# Patient Record
Sex: Female | Born: 1960 | Race: Black or African American | Hispanic: No | Smoking: Never smoker
Health system: Southern US, Community
[De-identification: ages and names within clinical notes are randomized; demographics above are authoritative.]

## PROBLEM LIST (undated history)

## (undated) DIAGNOSIS — M1990 Unspecified osteoarthritis, unspecified site: Secondary | ICD-10-CM

## (undated) DIAGNOSIS — T4145XA Adverse effect of unspecified anesthetic, initial encounter: Secondary | ICD-10-CM

## (undated) DIAGNOSIS — T8859XA Other complications of anesthesia, initial encounter: Secondary | ICD-10-CM

## (undated) DIAGNOSIS — Z9889 Other specified postprocedural states: Secondary | ICD-10-CM

## (undated) DIAGNOSIS — I499 Cardiac arrhythmia, unspecified: Secondary | ICD-10-CM

## (undated) DIAGNOSIS — E039 Hypothyroidism, unspecified: Secondary | ICD-10-CM

## (undated) DIAGNOSIS — D649 Anemia, unspecified: Secondary | ICD-10-CM

## (undated) DIAGNOSIS — F419 Anxiety disorder, unspecified: Secondary | ICD-10-CM

## (undated) DIAGNOSIS — R112 Nausea with vomiting, unspecified: Secondary | ICD-10-CM

## (undated) HISTORY — PX: TONSILLECTOMY: SUR1361

## (undated) HISTORY — PX: KNEE ARTHROSCOPY: SHX127

## (undated) HISTORY — PX: JOINT REPLACEMENT: SHX530

---

## 2012-06-10 ENCOUNTER — Ambulatory Visit: Payer: Self-pay | Admitting: Orthopedic Surgery

## 2014-11-02 ENCOUNTER — Ambulatory Visit: Payer: Self-pay | Admitting: Orthopedic Surgery

## 2016-07-09 ENCOUNTER — Other Ambulatory Visit: Payer: Self-pay | Admitting: Orthopedic Surgery

## 2016-07-09 DIAGNOSIS — M48061 Spinal stenosis, lumbar region without neurogenic claudication: Secondary | ICD-10-CM

## 2017-12-30 ENCOUNTER — Other Ambulatory Visit: Payer: Self-pay | Admitting: Orthopedic Surgery

## 2017-12-30 ENCOUNTER — Ambulatory Visit
Admission: RE | Admit: 2017-12-30 | Discharge: 2017-12-30 | Disposition: A | Discharge status: Home / Self Care | Payer: Medicare Other | Source: Ambulatory Visit | Attending: Orthopedic Surgery | Admitting: Orthopedic Surgery

## 2017-12-30 DIAGNOSIS — M1712 Unilateral primary osteoarthritis, left knee: Secondary | ICD-10-CM | POA: Insufficient documentation

## 2017-12-30 DIAGNOSIS — M7122 Synovial cyst of popliteal space [Baker], left knee: Secondary | ICD-10-CM | POA: Insufficient documentation

## 2017-12-30 DIAGNOSIS — M25562 Pain in left knee: Secondary | ICD-10-CM | POA: Diagnosis present

## 2017-12-30 DIAGNOSIS — M2342 Loose body in knee, left knee: Secondary | ICD-10-CM | POA: Insufficient documentation

## 2018-01-27 ENCOUNTER — Encounter
Admission: RE | Admit: 2018-01-27 | Discharge: 2018-01-27 | Disposition: A | Discharge status: Home / Self Care | Payer: Medicare Other | Source: Ambulatory Visit | Attending: Orthopedic Surgery | Admitting: Orthopedic Surgery

## 2018-01-27 ENCOUNTER — Other Ambulatory Visit: Payer: Self-pay

## 2018-01-27 DIAGNOSIS — Z01812 Encounter for preprocedural laboratory examination: Secondary | ICD-10-CM | POA: Insufficient documentation

## 2018-01-27 HISTORY — DX: Unspecified osteoarthritis, unspecified site: M19.90

## 2018-01-27 HISTORY — DX: Hypothyroidism, unspecified: E03.9

## 2018-01-27 HISTORY — DX: Anxiety disorder, unspecified: F41.9

## 2018-01-27 HISTORY — DX: Anemia, unspecified: D64.9

## 2018-01-27 HISTORY — DX: Cardiac arrhythmia, unspecified: I49.9

## 2018-01-27 LAB — CBC
HEMATOCRIT: 35.9 % (ref 35.0–47.0)
Hemoglobin: 12.3 g/dL (ref 12.0–16.0)
MCH: 32.2 pg (ref 26.0–34.0)
MCHC: 34.3 g/dL (ref 32.0–36.0)
MCV: 93.7 fL (ref 80.0–100.0)
PLATELETS: 310 10*3/uL (ref 150–440)
RBC: 3.83 MIL/uL (ref 3.80–5.20)
RDW: 13.7 % (ref 11.5–14.5)
WBC: 7.2 10*3/uL (ref 3.6–11.0)

## 2018-01-27 LAB — URINALYSIS, ROUTINE W REFLEX MICROSCOPIC
Bilirubin Urine: NEGATIVE
Glucose, UA: NEGATIVE mg/dL
Hgb urine dipstick: NEGATIVE
Ketones, ur: NEGATIVE mg/dL
Leukocytes, UA: NEGATIVE
Nitrite: NEGATIVE
PROTEIN: NEGATIVE mg/dL
SPECIFIC GRAVITY, URINE: 1.009 (ref 1.005–1.030)
pH: 5 (ref 5.0–8.0)

## 2018-01-27 LAB — TYPE AND SCREEN
ABO/RH(D): O POS
ANTIBODY SCREEN: NEGATIVE

## 2018-01-27 LAB — BASIC METABOLIC PANEL
ANION GAP: 9 (ref 5–15)
BUN: 18 mg/dL (ref 6–20)
CO2: 26 mmol/L (ref 22–32)
Calcium: 9.2 mg/dL (ref 8.9–10.3)
Chloride: 104 mmol/L (ref 101–111)
Creatinine, Ser: 1 mg/dL (ref 0.44–1.00)
GFR calc Af Amer: >60 mL/min (ref >60)
GLUCOSE: 91 mg/dL (ref 65–99)
POTASSIUM: 3.7 mmol/L (ref 3.5–5.1)
Sodium: 139 mmol/L (ref 135–145)

## 2018-01-27 LAB — PROTIME-INR
INR: 1
Prothrombin Time: 13.1 seconds (ref 11.4–15.2)

## 2018-01-27 LAB — APTT: APTT: 34 s (ref 24–36)

## 2018-01-27 LAB — SURGICAL PCR SCREEN
MRSA, PCR: NEGATIVE
STAPHYLOCOCCUS AUREUS: NEGATIVE

## 2018-01-27 LAB — SEDIMENTATION RATE: SED RATE: 43 mm/h — AB (ref 0–30)

## 2018-01-27 NOTE — Patient Instructions (Addendum)
  Your procedure is scheduled on: Tuesday February 09, 2018 Report to Same Day Surgery 2nd floor medical mall (Medical Mall Entrance-take elevator on left to 2nd floor.  Check in with surgery information desk.) To find out your arrival time please call [phone] between 1PM - 3PM on Monday February 08, 2018  Remember: Instructions that are not followed completely may result in serious medical risk, up to and including death, or upon the discretion of your surgeon and anesthesiologist your surgery may need to be rescheduled.    _x___ 1. Do not eat food after midnight the night before your procedure. You may drink clear liquids up to 2 hours before you are scheduled to arrive at the hospital for your procedure.  Do not drink clear liquids within 2 hours of your scheduled arrival to the hospital.  Clear liquids include  --Water or Apple juice without pulp  --Clear carbohydrate beverage such as Gatorade  --Black Coffee or Clear Tea (No milk, no creamers, do not add anything to the coffee or tea Type 1 and type 2 diabetics should only drink water.  No gum chewing or hard candies.     __x__ 2. No Alcohol for 24 hours before or after surgery.   __x__3. No Smoking or e-cigarettes for 24 prior to surgery.  Do not use any chewable tobacco products for at least 6 hour prior to surgery   ____  4. Bring all medications with you on the day of surgery if instructed.    __x__ 5. Notify your doctor if there is any change in your medical condition     (cold, fever, infections).   __x__6. On the morning of surgery brush your teeth with toothpaste and water.  You may rinse your mouth with mouth wash if you wish.  Do not swallow any toothpaste or mouthwash.   Do not wear jewelry, make-up, hairpins, clips or nail polish.  Do not wear lotions, powders, or perfumes. You may wear deodorant.  Do not shave 48 hours prior to surgery.  Do not bring valuables to the hospital.    [facility] is not responsible for any  belongings or valuables.               Contacts, dentures or bridgework may not be worn into surgery.  Leave your suitcase in the car. After surgery it may be brought to your room.  For patients admitted to the hospital, discharge time is determined by your treatment team.    Patients discharged the day of surgery will not be allowed to drive home.  You will need someone to drive you home and stay with you the night of your procedure.    Please read over the following fact sheets that you were given:   [facility] Preparing for [facility] and or MRSA Information   _x___ Take anti-hypertensive listed below, cardiac, seizure, asthma,     anti-reflux and psychiatric medicines. These include:  1. Citalopram/Celexa  2. Fluticasone/Flonase  3. Gabapentin/Neurontin  4. Levothyroxine/Synthroid  5. Multivitamin/One-a-day  6. Lorcet as needed  _x___ Use CHG Soap or sage wipes as directed on instruction sheet.   _x___Stop Anti-inflammatories such as Advil, Aleve, Ibuprofen, Motrin, Naproxen, Naprosyn, Goodies powders or aspirin products. OK to take Tylenol and Celebrex.  Stop Etodolac/Lodine 7 days before surgery- Tuesday February 02, 2018   _x___ Stop supplements until after surgery.  But may continue Vitamin D, Vitamin B, and multivitamin.

## 2018-01-28 LAB — URINE CULTURE: Culture: NO GROWTH

## 2018-02-08 MED ORDER — CLINDAMYCIN PHOSPHATE 900 MG/50ML IV SOLN
900.0000 mg | Freq: Once | INTRAVENOUS | Status: AC
  Administered 2018-02-09: 900 mg via INTRAVENOUS

## 2018-02-08 MED ORDER — TRANEXAMIC ACID 1000 MG/10ML IV SOLN
1000.0000 mg | INTRAVENOUS | Status: DC
  Filled 2018-02-08: qty 10

## 2018-02-09 ENCOUNTER — Encounter: Payer: Self-pay | Admitting: *Deleted

## 2018-02-09 ENCOUNTER — Encounter
Admission: RE | Disposition: A | Discharge status: Home Health | Payer: Self-pay | Source: Ambulatory Visit | Attending: Orthopedic Surgery

## 2018-02-09 ENCOUNTER — Inpatient Hospital Stay
Admission: RE | Admit: 2018-02-09 | Discharge: 2018-02-11 | DRG: 470 | Disposition: A | Discharge status: Home Health | Payer: Medicare Other | Source: Ambulatory Visit | Attending: Orthopedic Surgery | Admitting: Orthopedic Surgery

## 2018-02-09 ENCOUNTER — Inpatient Hospital Stay: Payer: Medicare Other

## 2018-02-09 ENCOUNTER — Inpatient Hospital Stay: Payer: Medicare Other | Admitting: Anesthesiology

## 2018-02-09 ENCOUNTER — Other Ambulatory Visit: Payer: Self-pay

## 2018-02-09 DIAGNOSIS — Z79890 Hormone replacement therapy (postmenopausal): Secondary | ICD-10-CM

## 2018-02-09 DIAGNOSIS — E669 Obesity, unspecified: Secondary | ICD-10-CM | POA: Diagnosis present

## 2018-02-09 DIAGNOSIS — M1712 Unilateral primary osteoarthritis, left knee: Secondary | ICD-10-CM | POA: Diagnosis present

## 2018-02-09 DIAGNOSIS — E039 Hypothyroidism, unspecified: Secondary | ICD-10-CM | POA: Diagnosis present

## 2018-02-09 DIAGNOSIS — R269 Unspecified abnormalities of gait and mobility: Secondary | ICD-10-CM | POA: Diagnosis present

## 2018-02-09 DIAGNOSIS — Z6832 Body mass index (BMI) 32.0-32.9, adult: Secondary | ICD-10-CM | POA: Diagnosis not present

## 2018-02-09 DIAGNOSIS — M791 Myalgia, unspecified site: Secondary | ICD-10-CM | POA: Diagnosis present

## 2018-02-09 DIAGNOSIS — Z79899 Other long term (current) drug therapy: Secondary | ICD-10-CM

## 2018-02-09 DIAGNOSIS — G8918 Other acute postprocedural pain: Secondary | ICD-10-CM

## 2018-02-09 DIAGNOSIS — Z880 Allergy status to penicillin: Secondary | ICD-10-CM | POA: Diagnosis not present

## 2018-02-09 DIAGNOSIS — E0590 Thyrotoxicosis, unspecified without thyrotoxic crisis or storm: Secondary | ICD-10-CM | POA: Diagnosis present

## 2018-02-09 DIAGNOSIS — F419 Anxiety disorder, unspecified: Secondary | ICD-10-CM | POA: Diagnosis present

## 2018-02-09 DIAGNOSIS — Z96652 Presence of left artificial knee joint: Secondary | ICD-10-CM

## 2018-02-09 DIAGNOSIS — Z91048 Other nonmedicinal substance allergy status: Secondary | ICD-10-CM

## 2018-02-09 DIAGNOSIS — D649 Anemia, unspecified: Secondary | ICD-10-CM | POA: Diagnosis present

## 2018-02-09 DIAGNOSIS — M25562 Pain in left knee: Secondary | ICD-10-CM | POA: Diagnosis present

## 2018-02-09 HISTORY — PX: TOTAL KNEE ARTHROPLASTY: SHX125

## 2018-02-09 LAB — ABO/RH: ABO/RH(D): O POS

## 2018-02-09 LAB — CBC
HEMATOCRIT: 35.7 % (ref 35.0–47.0)
Hemoglobin: 11.8 g/dL — ABNORMAL LOW (ref 12.0–16.0)
MCH: 31.5 pg (ref 26.0–34.0)
MCHC: 33.2 g/dL (ref 32.0–36.0)
MCV: 94.7 fL (ref 80.0–100.0)
Platelets: 299 10*3/uL (ref 150–440)
RBC: 3.77 MIL/uL — ABNORMAL LOW (ref 3.80–5.20)
RDW: 13.5 % (ref 11.5–14.5)
WBC: 7.6 10*3/uL (ref 3.6–11.0)

## 2018-02-09 LAB — CREATININE, SERUM
Creatinine, Ser: 1.05 mg/dL — ABNORMAL HIGH (ref 0.44–1.00)
GFR calc Af Amer: >60 mL/min (ref >60)
GFR, EST NON AFRICAN AMERICAN: 58 mL/min — AB (ref >60)

## 2018-02-09 SURGERY — ARTHROPLASTY, KNEE, TOTAL
Anesthesia: Spinal | Laterality: Left

## 2018-02-09 MED ORDER — OXYCODONE HCL 5 MG/5ML PO SOLN: 5.0000 mg | Freq: Once | ORAL | Status: DC | PRN

## 2018-02-09 MED ORDER — BISACODYL 10 MG RE SUPP
10.0000 mg | Freq: Every day | RECTAL | Status: DC | PRN
  Administered 2018-02-11: 10 mg via RECTAL
  Filled 2018-02-09: qty 1

## 2018-02-09 MED ORDER — ONDANSETRON HCL 4 MG/2ML IJ SOLN
4.0000 mg | Freq: Four times a day (QID) | INTRAMUSCULAR | Status: DC | PRN
  Administered 2018-02-09: 4 mg via INTRAVENOUS
  Filled 2018-02-09: qty 2

## 2018-02-09 MED ORDER — MAGNESIUM CITRATE PO SOLN
1.0000 | Freq: Once | ORAL | Status: AC | PRN
  Administered 2018-02-11: 1 via ORAL
  Filled 2018-02-09 (×2): qty 296

## 2018-02-09 MED ORDER — FENTANYL CITRATE (PF) 100 MCG/2ML IJ SOLN
INTRAMUSCULAR | Status: DC | PRN
  Administered 2018-02-09 (×2): 50 ug via INTRAVENOUS

## 2018-02-09 MED ORDER — PROPOFOL 500 MG/50ML IV EMUL
INTRAVENOUS | Status: AC
  Filled 2018-02-09: qty 50

## 2018-02-09 MED ORDER — BUPIVACAINE HCL (PF) 0.5 % IJ SOLN
INTRAMUSCULAR | Status: DC | PRN
  Administered 2018-02-09: 3 mL

## 2018-02-09 MED ORDER — DIPHENHYDRAMINE HCL 12.5 MG/5ML PO ELIX: 12.5000 mg | ORAL_SOLUTION | ORAL | Status: DC | PRN

## 2018-02-09 MED ORDER — METHOCARBAMOL 500 MG PO TABS
500.0000 mg | ORAL_TABLET | Freq: Four times a day (QID) | ORAL | Status: DC | PRN
  Administered 2018-02-09 – 2018-02-10 (×2): 500 mg via ORAL
  Filled 2018-02-09 (×2): qty 1

## 2018-02-09 MED ORDER — NEOMYCIN-POLYMYXIN B GU 40-200000 IR SOLN
Status: AC
  Filled 2018-02-09: qty 20

## 2018-02-09 MED ORDER — TRANEXAMIC ACID 1000 MG/10ML IV SOLN
INTRAVENOUS | Status: DC | PRN
  Administered 2018-02-09: 1000 mg via INTRAVENOUS

## 2018-02-09 MED ORDER — ACETAMINOPHEN 325 MG PO TABS: 325.0000 mg | ORAL_TABLET | Freq: Four times a day (QID) | ORAL | Status: DC | PRN

## 2018-02-09 MED ORDER — NEOMYCIN-POLYMYXIN B GU 40-200000 IR SOLN
Status: DC | PRN
  Administered 2018-02-09: 14 mL

## 2018-02-09 MED ORDER — FAMOTIDINE 20 MG PO TABS
ORAL_TABLET | ORAL | Status: AC
  Filled 2018-02-09: qty 1

## 2018-02-09 MED ORDER — MORPHINE SULFATE 10 MG/ML IJ SOLN
INTRAMUSCULAR | Status: DC | PRN
  Administered 2018-02-09: 10 mg via SUBCUTANEOUS

## 2018-02-09 MED ORDER — DOCUSATE SODIUM 100 MG PO CAPS
100.0000 mg | ORAL_CAPSULE | Freq: Two times a day (BID) | ORAL | Status: DC
  Administered 2018-02-09 – 2018-02-11 (×4): 100 mg via ORAL
  Filled 2018-02-09 (×5): qty 1

## 2018-02-09 MED ORDER — ONDANSETRON HCL 4 MG PO TABS: 4.0000 mg | ORAL_TABLET | Freq: Four times a day (QID) | ORAL | Status: DC | PRN

## 2018-02-09 MED ORDER — LACTATED RINGERS IV SOLN
INTRAVENOUS | Status: DC
  Administered 2018-02-09 (×2): via INTRAVENOUS

## 2018-02-09 MED ORDER — PROPOFOL 500 MG/50ML IV EMUL
INTRAVENOUS | Status: DC | PRN
  Administered 2018-02-09: 50 ug/kg/min via INTRAVENOUS

## 2018-02-09 MED ORDER — LIDOCAINE HCL (CARDIAC) 20 MG/ML IV SOLN
INTRAVENOUS | Status: DC | PRN
  Administered 2018-02-09: 60 mg via INTRAVENOUS

## 2018-02-09 MED ORDER — SODIUM CHLORIDE 0.9 % IV SOLN
INTRAVENOUS | Status: DC | PRN
  Administered 2018-02-09: 60 mL

## 2018-02-09 MED ORDER — METHOCARBAMOL 1000 MG/10ML IJ SOLN
500.0000 mg | Freq: Four times a day (QID) | INTRAVENOUS | Status: DC | PRN
  Filled 2018-02-09: qty 5

## 2018-02-09 MED ORDER — MEPERIDINE HCL 50 MG/ML IJ SOLN: 6.2500 mg | INTRAMUSCULAR | Status: DC | PRN

## 2018-02-09 MED ORDER — ALUM & MAG HYDROXIDE-SIMETH 200-200-20 MG/5ML PO SUSP
30.0000 mL | ORAL | Status: DC | PRN
  Administered 2018-02-11: 30 mL via ORAL
  Filled 2018-02-09: qty 30

## 2018-02-09 MED ORDER — TRAMADOL HCL 50 MG PO TABS
50.0000 mg | ORAL_TABLET | Freq: Four times a day (QID) | ORAL | Status: DC
  Administered 2018-02-09 – 2018-02-11 (×8): 50 mg via ORAL
  Filled 2018-02-09 (×9): qty 1

## 2018-02-09 MED ORDER — BUPIVACAINE-EPINEPHRINE (PF) 0.25% -1:200000 IJ SOLN
INTRAMUSCULAR | Status: DC | PRN
  Administered 2018-02-09: 30 mL via PERINEURAL

## 2018-02-09 MED ORDER — MIDAZOLAM HCL 5 MG/5ML IJ SOLN
INTRAMUSCULAR | Status: DC | PRN
  Administered 2018-02-09: 2 mg via INTRAVENOUS

## 2018-02-09 MED ORDER — FLUTICASONE PROPIONATE 50 MCG/ACT NA SUSP
2.0000 | Freq: Every day | NASAL | Status: DC | PRN
  Filled 2018-02-09: qty 16

## 2018-02-09 MED ORDER — MORPHINE SULFATE (PF) 10 MG/ML IV SOLN
INTRAVENOUS | Status: AC
  Filled 2018-02-09: qty 1

## 2018-02-09 MED ORDER — HYDROCODONE-ACETAMINOPHEN 5-325 MG PO TABS
1.0000 | ORAL_TABLET | ORAL | Status: DC | PRN
  Administered 2018-02-11: 1 via ORAL
  Filled 2018-02-09: qty 1

## 2018-02-09 MED ORDER — METOCLOPRAMIDE HCL 10 MG PO TABS
5.0000 mg | ORAL_TABLET | Freq: Three times a day (TID) | ORAL | Status: DC | PRN
  Administered 2018-02-09: 10 mg via ORAL
  Filled 2018-02-09: qty 1

## 2018-02-09 MED ORDER — MIDAZOLAM HCL 2 MG/2ML IJ SOLN
INTRAMUSCULAR | Status: AC
  Filled 2018-02-09: qty 2

## 2018-02-09 MED ORDER — MENTHOL 3 MG MT LOZG
1.0000 | LOZENGE | OROMUCOSAL | Status: DC | PRN
  Filled 2018-02-09: qty 9

## 2018-02-09 MED ORDER — LIDOCAINE HCL (PF) 2 % IJ SOLN
INTRAMUSCULAR | Status: AC
  Filled 2018-02-09: qty 10

## 2018-02-09 MED ORDER — BUPIVACAINE LIPOSOME 1.3 % IJ SUSP
INTRAMUSCULAR | Status: AC
  Filled 2018-02-09: qty 20

## 2018-02-09 MED ORDER — ENOXAPARIN SODIUM 30 MG/0.3ML ~~LOC~~ SOLN
30.0000 mg | Freq: Two times a day (BID) | SUBCUTANEOUS | Status: DC
  Administered 2018-02-10 – 2018-02-11 (×3): 30 mg via SUBCUTANEOUS
  Filled 2018-02-09 (×3): qty 0.3

## 2018-02-09 MED ORDER — SODIUM CHLORIDE 0.9 % IJ SOLN
INTRAMUSCULAR | Status: AC
  Filled 2018-02-09: qty 50

## 2018-02-09 MED ORDER — PHENOL 1.4 % MT LIQD
1.0000 | OROMUCOSAL | Status: DC | PRN
  Filled 2018-02-09: qty 177

## 2018-02-09 MED ORDER — BUPIVACAINE-EPINEPHRINE (PF) 0.25% -1:200000 IJ SOLN
INTRAMUSCULAR | Status: AC
Start: 2018-02-09 — End: 2018-02-09
  Filled 2018-02-09: qty 30

## 2018-02-09 MED ORDER — SODIUM CHLORIDE 0.9 % IV SOLN
INTRAVENOUS | Status: DC
  Administered 2018-02-09: 22:00:00 via INTRAVENOUS

## 2018-02-09 MED ORDER — ZOLPIDEM TARTRATE 5 MG PO TABS: 5.0000 mg | ORAL_TABLET | Freq: Every evening | ORAL | Status: DC | PRN

## 2018-02-09 MED ORDER — FAMOTIDINE 20 MG PO TABS
20.0000 mg | ORAL_TABLET | Freq: Once | ORAL | Status: AC
  Administered 2018-02-09: 20 mg via ORAL

## 2018-02-09 MED ORDER — METOCLOPRAMIDE HCL 5 MG/ML IJ SOLN: 5.0000 mg | Freq: Three times a day (TID) | INTRAMUSCULAR | Status: DC | PRN

## 2018-02-09 MED ORDER — GABAPENTIN 300 MG PO CAPS
300.0000 mg | ORAL_CAPSULE | Freq: Four times a day (QID) | ORAL | Status: DC
Start: 2018-02-09 — End: 2018-02-11
  Administered 2018-02-09 – 2018-02-11 (×10): 300 mg via ORAL
  Filled 2018-02-09 (×10): qty 1

## 2018-02-09 MED ORDER — FENTANYL CITRATE (PF) 100 MCG/2ML IJ SOLN
INTRAMUSCULAR | Status: AC
  Filled 2018-02-09: qty 2

## 2018-02-09 MED ORDER — CITALOPRAM HYDROBROMIDE 20 MG PO TABS
20.0000 mg | ORAL_TABLET | Freq: Every day | ORAL | Status: DC
Start: 2018-02-09 — End: 2018-02-11
  Administered 2018-02-10 – 2018-02-11 (×2): 20 mg via ORAL
  Filled 2018-02-09 (×2): qty 1

## 2018-02-09 MED ORDER — LEVOTHYROXINE SODIUM 25 MCG PO TABS
125.0000 ug | ORAL_TABLET | Freq: Every day | ORAL | Status: DC
  Administered 2018-02-10 – 2018-02-11 (×2): 125 ug via ORAL
  Filled 2018-02-09 (×2): qty 1

## 2018-02-09 MED ORDER — SENNOSIDES-DOCUSATE SODIUM 8.6-50 MG PO TABS
1.0000 | ORAL_TABLET | Freq: Every evening | ORAL | Status: DC | PRN
  Administered 2018-02-09: 1 via ORAL
  Filled 2018-02-09: qty 1

## 2018-02-09 MED ORDER — CLINDAMYCIN PHOSPHATE 900 MG/50ML IV SOLN
900.0000 mg | Freq: Four times a day (QID) | INTRAVENOUS | Status: AC
  Administered 2018-02-09 – 2018-02-10 (×4): 900 mg via INTRAVENOUS
  Filled 2018-02-09 (×5): qty 50

## 2018-02-09 MED ORDER — OXYCODONE HCL 5 MG PO TABS: 5.0000 mg | ORAL_TABLET | Freq: Once | ORAL | Status: DC | PRN

## 2018-02-09 MED ORDER — BUPIVACAINE HCL (PF) 0.5 % IJ SOLN
INTRAMUSCULAR | Status: AC
  Filled 2018-02-09: qty 10

## 2018-02-09 MED ORDER — FENTANYL CITRATE (PF) 100 MCG/2ML IJ SOLN: 25.0000 ug | INTRAMUSCULAR | Status: DC | PRN

## 2018-02-09 MED ORDER — EPHEDRINE SULFATE 50 MG/ML IJ SOLN
INTRAMUSCULAR | Status: AC
  Filled 2018-02-09: qty 1

## 2018-02-09 MED ORDER — MORPHINE SULFATE (PF) 2 MG/ML IV SOLN
0.5000 mg | INTRAVENOUS | Status: DC | PRN
  Administered 2018-02-09: 1 mg via INTRAVENOUS
  Filled 2018-02-09: qty 1

## 2018-02-09 MED ORDER — PROPOFOL 10 MG/ML IV BOLUS
INTRAVENOUS | Status: DC | PRN
  Administered 2018-02-09: 30 mg via INTRAVENOUS

## 2018-02-09 MED ORDER — CLINDAMYCIN PHOSPHATE 900 MG/50ML IV SOLN
INTRAVENOUS | Status: AC
  Filled 2018-02-09: qty 50

## 2018-02-09 MED ORDER — HYDROCODONE-ACETAMINOPHEN 7.5-325 MG PO TABS
1.0000 | ORAL_TABLET | ORAL | Status: DC | PRN
  Administered 2018-02-09: 2 via ORAL
  Administered 2018-02-09: 1 via ORAL
  Administered 2018-02-10: 2 via ORAL
  Administered 2018-02-10: 1 via ORAL
  Administered 2018-02-10: 2 via ORAL
  Administered 2018-02-11 (×2): 1 via ORAL
  Filled 2018-02-09 (×2): qty 1
  Filled 2018-02-09: qty 2
  Filled 2018-02-09: qty 1
  Filled 2018-02-09: qty 2
  Filled 2018-02-09: qty 1
  Filled 2018-02-09: qty 2

## 2018-02-09 MED ORDER — PROMETHAZINE HCL 25 MG/ML IJ SOLN: 6.2500 mg | INTRAMUSCULAR | Status: DC | PRN

## 2018-02-09 MED ORDER — EPHEDRINE SULFATE 50 MG/ML IJ SOLN
INTRAMUSCULAR | Status: DC | PRN
  Administered 2018-02-09 (×2): 10 mg via INTRAVENOUS

## 2018-02-09 SURGICAL SUPPLY — 64 items
BANDAGE ACE 6X5 VEL STRL LF (GAUZE/BANDAGES/DRESSINGS) ×3 IMPLANT
BLADE SAW 1 (BLADE) ×3 IMPLANT
BLOCK CUTTING FEMUR 4+ LT MED (MISCELLANEOUS) ×3 IMPLANT
BLOCK CUTTING TIBIAL 4 LT CT (MISCELLANEOUS) ×3 IMPLANT
CANISTER SUCT 1200ML W/VALVE (MISCELLANEOUS) ×3 IMPLANT
CANISTER SUCT 3000ML PPV (MISCELLANEOUS) ×6 IMPLANT
CEMENT HV SMART SET (Cement) ×6 IMPLANT
CHLORAPREP W/TINT 26ML (MISCELLANEOUS) ×6 IMPLANT
COOLER POLAR GLACIER W/PUMP (MISCELLANEOUS) ×3 IMPLANT
CUFF TOURN 24 STER (MISCELLANEOUS) IMPLANT
CUFF TOURN 30 STER DUAL PORT (MISCELLANEOUS) ×3 IMPLANT
DRAPE SHEET LG 3/4 BI-LAMINATE (DRAPES) ×6 IMPLANT
ELECT CAUTERY BLADE 6.4 (BLADE) ×3 IMPLANT
ELECT REM PT RETURN 9FT ADLT (ELECTROSURGICAL) ×3
ELECTRODE REM PT RTRN 9FT ADLT (ELECTROSURGICAL) ×1 IMPLANT
FEMORAL COMP SZ4P LT SPHERE (Femur) ×3 IMPLANT
FEMUR BONE MODEL 4.9010 MEDACT (Bone Implant) ×3 IMPLANT
GAUZE PETRO XEROFOAM 1X8 (MISCELLANEOUS) ×3 IMPLANT
GAUZE SPONGE 4X4 12PLY STRL (GAUZE/BANDAGES/DRESSINGS) ×3 IMPLANT
GLOVE BIOGEL PI IND STRL 9 (GLOVE) ×1 IMPLANT
GLOVE BIOGEL PI INDICATOR 9 (GLOVE) ×2
GLOVE INDICATOR 8.0 STRL GRN (GLOVE) ×3 IMPLANT
GLOVE SURG ORTHO 8.0 STRL STRW (GLOVE) ×3 IMPLANT
GLOVE SURG SYN 9.0  PF PI (GLOVE) ×2
GLOVE SURG SYN 9.0 PF PI (GLOVE) ×1 IMPLANT
GOWN SRG 2XL LVL 4 RGLN SLV (GOWNS) ×1 IMPLANT
GOWN STRL NON-REIN 2XL LVL4 (GOWNS) ×2
GOWN STRL REUS W/ TWL LRG LVL3 (GOWN DISPOSABLE) ×1 IMPLANT
GOWN STRL REUS W/ TWL XL LVL3 (GOWN DISPOSABLE) ×1 IMPLANT
GOWN STRL REUS W/TWL LRG LVL3 (GOWN DISPOSABLE) ×2
GOWN STRL REUS W/TWL XL LVL3 (GOWN DISPOSABLE) ×2
HOLDER FOLEY CATH W/STRAP (MISCELLANEOUS) ×3 IMPLANT
HOOD PEEL AWAY FLYTE STAYCOOL (MISCELLANEOUS) ×6 IMPLANT
IMMBOLIZER KNEE 19 BLUE UNIV (SOFTGOODS) IMPLANT
KIT TURNOVER KIT A (KITS) ×3 IMPLANT
KNIFE SCULPS 14X20 (INSTRUMENTS) ×3 IMPLANT
NDL SAFETY ECLIPSE 18X1.5 (NEEDLE) ×1 IMPLANT
NEEDLE HYPO 18GX1.5 SHARP (NEEDLE) ×2
NEEDLE SPNL 18GX3.5 QUINCKE PK (NEEDLE) ×3 IMPLANT
NEEDLE SPNL 20GX3.5 QUINCKE YW (NEEDLE) ×3 IMPLANT
NS IRRIG 1000ML POUR BTL (IV SOLUTION) ×3 IMPLANT
PACK TOTAL KNEE (MISCELLANEOUS) ×3 IMPLANT
PAD WRAPON POLAR KNEE (MISCELLANEOUS) ×1 IMPLANT
PATELLA RESURFACING MEDACTA 02 (Bone Implant) ×3 IMPLANT
PULSAVAC PLUS IRRIG FAN TIP (DISPOSABLE) ×3
SOL .9 NS 3000ML IRR  AL (IV SOLUTION) ×2
SOL .9 NS 3000ML IRR UROMATIC (IV SOLUTION) ×1 IMPLANT
STAPLER SKIN PROX 35W (STAPLE) ×3 IMPLANT
STEM EXTENSION 11MMX30MM (Stem) ×3 IMPLANT
SUCTION FRAZIER HANDLE 10FR (MISCELLANEOUS) ×2
SUCTION TUBE FRAZIER 10FR DISP (MISCELLANEOUS) ×1 IMPLANT
SUT DVC 2 QUILL PDO  T11 36X36 (SUTURE) ×2
SUT DVC 2 QUILL PDO T11 36X36 (SUTURE) ×1 IMPLANT
SUT V-LOC 90 ABS DVC 3-0 CL (SUTURE) ×3 IMPLANT
SYR 20CC LL (SYRINGE) ×3 IMPLANT
SYR 50ML LL SCALE MARK (SYRINGE) ×6 IMPLANT
TIBIAL BONE MODEL LEFT (MISCELLANEOUS) ×3 IMPLANT
TIBIAL INSERT SZ4 LT 02120410F (Insert) ×3 IMPLANT
TIBIAL TRAY FIXED MEDACTA 0207 (Bone Implant) ×3 IMPLANT
TIP FAN IRRIG PULSAVAC PLUS (DISPOSABLE) ×1 IMPLANT
TOWEL OR 17X26 4PK STRL BLUE (TOWEL DISPOSABLE) ×3 IMPLANT
TOWER CARTRIDGE SMART MIX (DISPOSABLE) ×3 IMPLANT
TRAY FOLEY W/METER SILVER 16FR (SET/KITS/TRAYS/PACK) ×3 IMPLANT
WRAPON POLAR PAD KNEE (MISCELLANEOUS) ×3

## 2018-02-09 NOTE — Anesthesia Preprocedure Evaluation (Signed)
Anesthesia Evaluation  Patient identified by MRN, date of birth, ID band Patient awake    Reviewed: Allergy & Precautions, NPO status , Patient's Chart, lab work & pertinent test results  History of Anesthesia Complications Negative for: history of anesthetic complications  Airway Mallampati: III  TM Distance: >3 FB Neck ROM: Full    Dental  (+) Missing   Pulmonary neg pulmonary ROS, neg sleep apnea, neg COPD,    breath sounds clear to auscultation- rhonchi (-) wheezing      Cardiovascular Exercise Tolerance: Good (-) hypertension(-) CAD, (-) Past MI, (-) Cardiac Stents and (-) CABG  Rhythm:Regular Rate:Normal - Systolic murmurs and - Diastolic murmurs    Neuro/Psych Anxiety negative neurological ROS     GI/Hepatic negative GI ROS, Neg liver ROS,   Endo/Other  neg diabetesHypothyroidism   Renal/GU negative Renal ROS     Musculoskeletal  (+) Arthritis ,   Abdominal (+) + obese,   Peds  Hematology  (+) anemia ,   Anesthesia Other Findings Past Medical History: No date: Anemia No date: Anxiety No date: Arthritis No date: Dysrhythmia No date: Hypothyroidism No date: Neuromuscular disorder (HCC)   Reproductive/Obstetrics                             Lab Results  Component Value Date   WBC 7.2 01/27/2018   HGB 12.3 01/27/2018   HCT 35.9 01/27/2018   MCV 93.7 01/27/2018   PLT 310 01/27/2018    Anesthesia Physical Anesthesia Plan  ASA: II  Anesthesia Plan: Spinal   Post-op Pain Management:    Induction:   PONV Risk Score and Plan: 2 and Propofol infusion  Airway Management Planned: Natural Airway  Additional Equipment:   Intra-op Plan:   Post-operative Plan:   Informed Consent: I have reviewed the patients History and Physical, chart, labs and discussed the procedure including the risks, benefits and alternatives for the proposed anesthesia with the patient or  authorized representative who has indicated his/her understanding and acceptance.   Dental advisory given  Plan Discussed with: CRNA and Anesthesiologist  Anesthesia Plan Comments:         Anesthesia Quick Evaluation

## 2018-02-09 NOTE — NC FL2 (Addendum)
  [place] MEDICAID FL2 LEVEL OF CARE SCREENING TOOL     IDENTIFICATION  Patient Name: Alyssa FosterLinda Hughes Birthdate: 08/31/1961 Sex: female Admission Date (Current Location): 02/09/2018  [place]   Facility and Address:  [facility], [address], [place], [place] [address]      Provider Number: [id number]  Attending Physician Name and Address:  Kennedy BuckerMenz, Michael, MD  Relative Name and Phone Number:       Current Level of Care: Hospital Recommended Level of Care: Skilled Nursing Facility Prior Approval Number:    Date Approved/Denied:   PASRR Number: ([id number] [id number])  Discharge Plan: SNF    Current Diagnoses: Patient Active Problem List   Diagnosis Date Noted  . Status post total knee replacement using cement, left 02/09/2018    Orientation RESPIRATION BLADDER Height & Weight     Self, Time, Situation, Place  Normal Continent Weight: 230 lb (104.3 kg) Height:  5\' 11"  (180.3 cm)  BEHAVIORAL SYMPTOMS/MOOD NEUROLOGICAL BOWEL NUTRITION STATUS      Continent Diet (Clear liquid to be advanced)  AMBULATORY STATUS COMMUNICATION OF NEEDS Skin   Extensive Assist Verbally Surgical wounds(Incision Left Knee)                       Personal Care Assistance Level of Assistance  Bathing, Feeding, Dressing Bathing Assistance: Limited assistance Feeding assistance: Independent Dressing Assistance: Limited assistance     Functional Limitations Info  Sight, Hearing, Speech Sight Info: Adequate Hearing Info: Adequate Speech Info: Adequate    SPECIAL CARE FACTORS FREQUENCY  PT (By licensed PT), OT (By licensed OT)     PT Frequency: (5) OT Frequency: (5)            Contractures      Additional Factors Info  Code Status, Allergies Code Status Info: (Not on file) Allergies Info: (PENICILLINS)           Current Medications (02/09/2018):  This is the current hospital active medication list Current  Facility-Administered Medications  Medication Dose Route Frequency Provider Last Rate Last Dose  . clindamycin (CLEOCIN) 900 MG/50ML IVPB           . famotidine (PEPCID) 20 MG tablet           . fentaNYL (SUBLIMAZE) injection 25-50 mcg  25-50 mcg Intravenous Q5 min PRN Penwarden, Amy, MD      . lactated ringers infusion   Intravenous Continuous Yevette EdwardsAdams, James G, MD 75 mL/hr at 02/09/18 [phone]    . meperidine (DEMEROL) injection 6.25-12.5 mg  6.25-12.5 mg Intravenous Q5 min PRN Penwarden, Amy, MD      . oxyCODONE (Oxy IR/ROXICODONE) immediate release tablet 5 mg  5 mg Oral Once PRN Penwarden, Amy, MD       Or  . oxyCODONE (ROXICODONE) 5 MG/5ML solution 5 mg  5 mg Oral Once PRN Penwarden, Amy, MD      . promethazine (PHENERGAN) injection 6.25-12.5 mg  6.25-12.5 mg Intravenous Q15 min PRN Penwarden, Amy, MD      . tranexamic acid (CYKLOKAPRON) 1,000 mg in sodium chloride 0.9 % 100 mL IVPB  1,000 mg Intravenous To OR Kennedy BuckerMenz, Michael, MD         Discharge Medications: Please see discharge summary for a list of discharge medications.  Relevant Imaging Results:  Relevant Lab Results:   Additional Information (SSN: [id number])  Payton SparkAnanda A Audery Wassenaar, Student-Social Work

## 2018-02-09 NOTE — Transfer of Care (Signed)
Immediate Anesthesia Transfer of Care Note  Patient: Alyssa FosterLinda Steckman  Procedure(s) Performed: TOTAL KNEE ARTHROPLASTY (Left )  Patient Location: PACU  Anesthesia Type:General and Spinal  Level of Consciousness: awake  Airway & Oxygen Therapy: Patient Spontanous Breathing  Post-op Assessment: Report given to RN  Post vital signs: stable  Last Vitals:  Vitals:   02/09/18 0851  BP: 132/87  Pulse: 92  Resp: 18  Temp: 36.7 C  SpO2: 98%    Last Pain:  Vitals:   02/09/18 0851  TempSrc: Oral  PainSc: 8       Patients Stated Pain Goal: 3 (02/09/18 0851)  Complications: No apparent anesthesia complications

## 2018-02-09 NOTE — Op Note (Signed)
02/09/2018  11:35 AM  PATIENT:  Alyssa FosterLinda Hughes  57 y.o. female  PRE-OPERATIVE DIAGNOSIS:  PRIMARY LOCALIZED OSTEOARTHRITIS OF LEFT KNEE  POST-OPERATIVE DIAGNOSIS:  PRIMARY LOCALIZED OSTEOARTHRITIS OF LEFT   PROCEDURE:  Procedure(s): TOTAL KNEE ARTHROPLASTY (Left)  SURGEON: Leitha SchullerMichael J Corbin Falck, MD  ASSISTANTS: Cranston Neighborhris Gaines PA-C  ANESTHESIA:   spinal  EBL:  Total I/O In: 1300 [I.V.:1300] Out: 300 [Urine:200; Blood:100]  BLOOD ADMINISTERED:none  DRAINS: none   LOCAL MEDICATIONS USED:  MARCAINE    and OTHER morphine and Exparel  SPECIMEN:  No Specimen  DISPOSITION OF SPECIMEN:  N/A  COUNTS:  YES  TOURNIQUET:   Total Tourniquet Time Documented: Thigh (Left) - 17 minutes Total: Thigh (Left) - 17 minutes   IMPLANTS: Medacta GMK 4+ femur, 4 tibia left, 10 mm insert with short stem and tibial component and 2 patella all components cemented  DICTATION: .Dragon Dictation  patient brought the operating room and after adequate anesthesia was obtained the  left leg was prepped and draped in sterile fashion was turned by the upper thigh. After patient identification and timeout procedures were completed midline incision was made followed by medial parapatellar arthrotomy. Inspection revealed moderate degenerative changes throughout the knee particularly medialcompartment there is exposed bone over the entire condyle tibial and femoral without significant bone loss. . Fat pad and PCL and anterior cruciate ligament were excised at this time and proximal tibia cutting block applied with proximal tibia cut carried out.. The 4-in-1 cutting block was applied anterior posterior and chamfer cuts made. The proximal tibia was prepared with removal of posterior horns of menisci. Placement of the4 tibial baseplateand proximal tibial preparation with drillingwithproximal preparation for short stem. With the trial baseplate in place Z6+[id number]+femur trial was placed and a 10mm insert gave excellent stability.  Distal femoral drill holes were made followed by the trochlear groove cut with thereaming for the trochlear groove. These trials were then removed and the patella cut using the patellar cutting guide and measured to a size2.injection of the above local was placed and the tourniquet then raised.Bony surfaces were thoroughly irrigated and dried,the tibial component was cemented into place first followed by the placement of the polyethylene component,set screw with torque screwdriver and femoral component with excess cement removed and the knee held in extension patellar button was then clamped into place after the cementhadset the patella did track well, tourniquet then let down and bleeding checked electrocautery. After thorough irrigation of the knee the arthrotomy was repaired heavy Quill suture to close the capsule.3-0 v-locsubcutaneously followed by skin staples, Xeroform 4 x 4's ABD web roll, followed by Ace wrapand Polar Care    PLAN OF CARE: Admit to inpatient   PATIENT DISPOSITION:  PACU - hemodynamically stable.

## 2018-02-09 NOTE — H&P (Signed)
Reviewed paper H+P, will be scanned into chart. No changes noted.  

## 2018-02-09 NOTE — Progress Notes (Signed)
PT Cancellation Note  Patient Details Name: Deem Marmol MRN: [id number] DOB: 1961/08/10   Cancelled Treatment:    Reason Eval/Treat Not Completed: Patient not medically ready;Medical issues which prohibited therapy(Chart reviewed. Met with patient is is very nauseated at this time, and per husband 1x emesis earlier. RN is aware. WIll hold evaluaton at time and attempt again at later date/time. )   4:15 PM, 02/09/18 Etta Grandchild, PT, DPT Physical Therapist - [facility]  [phone] ([place])     Buccola,Allan C 02/09/2018, 4:15 PM

## 2018-02-09 NOTE — Anesthesia Post-op Follow-up Note (Signed)
Anesthesia QCDR form completed.        

## 2018-02-09 NOTE — Anesthesia Procedure Notes (Signed)
Spinal  Start time: 02/09/2018 9:40 AM End time: 02/09/2018 9:46 AM Staffing Resident/CRNA: Bari MantisBrookover, Chayna Surratt I, CRNA Preanesthetic Checklist Completed: patient identified, site marked, surgical consent, pre-op evaluation, timeout performed, IV checked, risks and benefits discussed and monitors and equipment checked Spinal Block Patient position: sitting Prep: ChloraPrep and site prepped and draped Patient monitoring: continuous pulse ox and blood pressure Approach: right paramedian Location: L3-4 Injection technique: single-shot Needle Needle type: Pencan  Needle gauge: 24 G Needle length: 10 cm Assessment Sensory level: T8

## 2018-02-10 ENCOUNTER — Encounter: Payer: Self-pay | Admitting: Orthopedic Surgery

## 2018-02-10 NOTE — Progress Notes (Signed)
Physical Therapy Evaluation Patient Details Name: Alyssa Hughes MRN: [id number] DOB: [birth year] Today's Date: 02/10/2018   History of Present Illness  Pt underwent L TKR with post-op nausea and vomiting which prohibiting therapy on POD#1. PMH includes anxiety, OA, hypothyroidism, and obesity  Clinical Impression  Pt admitted with above diagnosis. Pt currently with functional limitations due to the deficits listed below (see PT Problem List).  Pt is modified independent for bed mobility. CGA only for transfers. Cues for safe hand placement with transfers. Decreased weight shifting to LLE, good RLE strength. Pt is able to ambulate to door, around bed and to recliner. Cues for proper sequencing with rolling walker. Gradually improving step length bilaterally as distance progresses. Decreased weight shifting to LLE. AAROM is -14 to 70 degrees and limited by pain. She is able to complete all supine exercises in bed without issue. Pt will benefit from PT services to address deficits in strength, balance, and mobility in order to return to full function at home.     Follow Up Recommendations Home health PT    Equipment Recommendations  Rolling walker with 5" wheels;3in1 (PT)    Recommendations for Other Services OT consult     Precautions / Restrictions Precautions Precautions: Fall;Knee Precaution Booklet Issued: Yes (comment) Required Braces or Orthoses: Knee Immobilizer - Left Knee Immobilizer - Left: Discontinue once straight leg raise with < 10 degree lag Restrictions Weight Bearing Restrictions: Yes RLE Weight Bearing: Weight bearing as tolerated      Mobility  Bed Mobility Overal bed mobility: Modified Independent             General bed mobility comments: Increased time, HOB elevated, and use of bed rails  Transfers Overall transfer level: Needs assistance Equipment used: Rolling walker (2 wheeled) Transfers: Sit to/from Stand Sit to Stand: Min guard         General  transfer comment: Cues for safe hand placement. Decreased weight shifting to LLE, good RLE strength  Ambulation/Gait Ambulation/Gait assistance: Min guard Ambulation Distance (Feet): 40 Feet Assistive device: Rolling walker (2 wheeled)     Gait velocity interpretation: <1.8 ft/sec, indicative of risk for recurrent falls General Gait Details: Pt is able to ambulate to door, around bed and to recliner. Cues for proper sequencing with rolling walker. Gradually improving step length bilaterally as distance progresses. Decreased weight shifting to LLE  Stairs            Wheelchair Mobility    Modified Rankin (Stroke Patients Only)       Balance Overall balance assessment: Needs assistance Sitting-balance support: No upper extremity supported Sitting balance-Leahy Scale: Good     Standing balance support: Bilateral upper extremity supported Standing balance-Leahy Scale: Fair                               Pertinent Vitals/Pain Pain Assessment: 0-10 Pain Score: 5  Pain Location: L knee Pain Descriptors / Indicators: Operative site guarding Pain Intervention(s): Monitored during session    Home Living Family/patient expects to be discharged to:: Private residence Living Arrangements: Spouse/significant other Available Help at Discharge: Family Type of Home: House Home Access: Stairs to enter Entrance Stairs-Rails: Can reach both Entrance Stairs-Number of Steps: 5 Home Layout: One level Home Equipment: Shower seat(No walker or cane)      Prior Function Level of Independence: Independent         Comments: Independent with ADLs/IADLs. Ambulates without assistive device. 1 fall  in the last 12 months. Drives but not currently working     Hand Dominance   Dominant Hand: Right    Extremity/Trunk Assessment   Upper Extremity Assessment Upper Extremity Assessment: Overall WFL for tasks assessed    Lower Extremity Assessment Lower Extremity  Assessment: LLE deficits/detail LLE Deficits / Details: 2 finger assist for L SLR and SAQ. Full DF/PF. RLE strength grossly WFL       Communication   Communication: No difficulties  Cognition Arousal/Alertness: Awake/alert Behavior During Therapy: WFL for tasks assessed/performed Overall Cognitive Status: Within Functional Limits for tasks assessed                                        General Comments      Exercises Total Joint Exercises Ankle Circles/Pumps: Both;10 reps Quad Sets: Both;10 reps Gluteal Sets: Both;10 reps Towel Squeeze: Both;10 reps Short Arc Quad: Left;10 reps Heel Slides: Left;10 reps Hip ABduction/ADduction: Left;10 reps Straight Leg Raises: Left;10 reps Goniometric ROM: -14 to 70 degrees AAROM, pain limited   Assessment/Plan    PT Assessment Patient needs continued PT services  PT Problem List Decreased strength;Decreased range of motion;Decreased activity tolerance;Decreased balance;Decreased mobility;Pain       PT Treatment Interventions DME instruction;Gait training;Stair training;Functional mobility training;Therapeutic activities;Therapeutic exercise;Balance training;Neuromuscular re-education;Patient/family education;Manual techniques    PT Goals (Current goals can be found in the Care Plan section)  Acute Rehab PT Goals Patient Stated Goal: Return to prior level of function PT Goal Formulation: With patient Time For Goal Achievement: 02/24/18 Potential to Achieve Goals: Good    Frequency BID   Barriers to discharge        Co-evaluation               AM-PAC PT "6 Clicks" Daily Activity  Outcome Measure Difficulty turning over in bed (including adjusting bedclothes, sheets and blankets)?: A Little Difficulty moving from lying on back to sitting on the side of the bed? : A Little Difficulty sitting down on and standing up from a chair with arms (e.g., wheelchair, bedside commode, etc,.)?: A Little Help needed  moving to and from a bed to chair (including a wheelchair)?: A Little Help needed walking in hospital room?: A Little Help needed climbing 3-5 steps with a railing? : A Lot 6 Click Score: 17    End of Session Equipment Utilized During Treatment: Gait belt Activity Tolerance: Patient tolerated treatment well Patient left: in chair;with call bell/phone within reach;with chair alarm set;with SCD's reapplied;Other (comment)(towel roll under heel, polar care in place)   PT Visit Diagnosis: Unsteadiness on feet (R26.81);Muscle weakness (generalized) (M62.81);Difficulty in walking, not elsewhere classified (R26.2);Pain Pain - Right/Left: Left Pain - part of body: Knee    Time: 1000-1023 PT Time Calculation (min) (ACUTE ONLY): 23 min   Charges:   PT Evaluation $PT Eval Low Complexity: 1 Low PT Treatments $Therapeutic Exercise: 8-22 mins   PT G Codes:        Sharalyn InkJason D Osha Errico PT, DPT    Alyssa Hughes 02/10/2018, 11:46 AM

## 2018-02-10 NOTE — Progress Notes (Signed)
Physical Therapy Treatment Patient Details Name: Alyssa Hughes MRN: [id number] DOB: June 19, 1961 Today's Date: 02/10/2018    History of Present Illness Pt underwent L TKR with post-op nausea and vomiting which prohibiting therapy on POD#1. PMH includes anxiety, OA, hypothyroidism, and obesity    PT Comments    Pt making good progress with therapy this afternoon. She is able to complete all supine and seated exercises as instructed. She is able to ambulate to nurse station and back to bed. Cues for proper sequencing to progress to step-through pattern. Pt able to progress to partial step-through but with increase in L knee pain. Pt reports increase in pain during ambulation. Gradually she places less weight on LLE. Will continue to progress ambulation distance tomorrow and practice stairs. Pt will benefit from PT services to address deficits in strength, balance, and mobility in order to return to full function at home.    Follow Up Recommendations  Home health PT     Equipment Recommendations  Rolling walker with 5" wheels;3in1 (PT)    Recommendations for Other Services OT consult     Precautions / Restrictions Precautions Precautions: Fall;Knee Precaution Booklet Issued: Yes (comment) Required Braces or Orthoses: Knee Immobilizer - Left Knee Immobilizer - Left: Discontinue once straight leg raise with < 10 degree lag Restrictions Weight Bearing Restrictions: Yes RLE Weight Bearing: Weight bearing as tolerated    Mobility  Bed Mobility Overal bed mobility: Needs Assistance Bed Mobility: Sit to Supine       Sit to supine: Min assist   General bed mobility comments: Pt requires minA+1 to assist LLE when returning to bed. Cues for proper sequencing. Overhead trapeze for adjusting in bed  Transfers Overall transfer level: Needs assistance Equipment used: Rolling walker (2 wheeled) Transfers: Sit to/from Stand Sit to Stand: Min guard         General transfer comment: Cues  for safe hand placement. Decreased weight shifting to LLE, good RLE strength. Increased time and effort required to come to standing  Ambulation/Gait Ambulation/Gait assistance: Min guard Ambulation Distance (Feet): 100 Feet Assistive device: Rolling walker (2 wheeled)     Gait velocity interpretation: <1.8 ft/sec, indicative of risk for recurrent falls General Gait Details: Pt is able to ambulate to nurse station and back to bed. Cues for proper sequencing to progress to step-through pattern. Pt able to progress to partial step-through but with increase in L knee pain. Pt reports increase in pain during ambulation. Gradually she places less weight on LLE.    Stairs            Wheelchair Mobility    Modified Rankin (Stroke Patients Only)       Balance Overall balance assessment: Needs assistance Sitting-balance support: No upper extremity supported Sitting balance-Leahy Scale: Good     Standing balance support: Bilateral upper extremity supported Standing balance-Leahy Scale: Fair                              Cognition Arousal/Alertness: Awake/alert Behavior During Therapy: WFL for tasks assessed/performed Overall Cognitive Status: Within Functional Limits for tasks assessed                                        Exercises Total Joint Exercises Ankle Circles/Pumps: Both;10 reps Quad Sets: Both;10 reps Gluteal Sets: Both;10 reps Towel Squeeze: Both;10 reps Short Arc Quad:  Left;10 reps Heel Slides: Left;10 reps Hip ABduction/ADduction: Left;10 reps Straight Leg Raises: Left;10 reps Long Arc Quad: Left;10 reps Knee Flexion: Left;10 reps Goniometric ROM: -14 to 70 degrees AAROM, pain limited Marching in Standing: Both;10 reps    General Comments        Pertinent Vitals/Pain Pain Assessment: 0-10 Pain Score: 6  Pain Location: L knee Pain Descriptors / Indicators: Operative site guarding Pain Intervention(s): Monitored during  session    Home Living Family/patient expects to be discharged to:: Private residence Living Arrangements: Spouse/significant other Available Help at Discharge: Family Type of Home: House Home Access: Stairs to enter Entrance Stairs-Rails: Can reach both Home Layout: One level Home Equipment: Shower seat(No walker or cane)      Prior Function Level of Independence: Independent      Comments: Independent with ADLs/IADLs. Ambulates without assistive device. 1 fall in the last 12 months. Drives but not currently working   PT Goals (current goals can now be found in the care plan section) Acute Rehab PT Goals Patient Stated Goal: Return to prior level of function PT Goal Formulation: With patient Time For Goal Achievement: 02/24/18 Potential to Achieve Goals: Good Progress towards PT goals: Progressing toward goals    Frequency    BID      PT Plan Current plan remains appropriate    Co-evaluation              AM-PAC PT "6 Clicks" Daily Activity  Outcome Measure  Difficulty turning over in bed (including adjusting bedclothes, sheets and blankets)?: A Little Difficulty moving from lying on back to sitting on the side of the bed? : A Little Difficulty sitting down on and standing up from a chair with arms (e.g., wheelchair, bedside commode, etc,.)?: A Little Help needed moving to and from a bed to chair (including a wheelchair)?: A Little Help needed walking in hospital room?: A Little Help needed climbing 3-5 steps with a railing? : A Lot 6 Click Score: 17    End of Session Equipment Utilized During Treatment: Gait belt Activity Tolerance: Patient tolerated treatment well Patient left: with call bell/phone within reach;with SCD's reapplied;Other (comment);in bed;with bed alarm set(bone phone and polar care in place) Nurse Communication: Mobility status PT Visit Diagnosis: Unsteadiness on feet (R26.81);Muscle weakness (generalized) (M62.81);Difficulty in walking,  not elsewhere classified (R26.2);Pain Pain - Right/Left: Left Pain - part of body: Knee     Time: 1431-1454 PT Time Calculation (min) (ACUTE ONLY): 23 min  Charges:  $Gait Training: 8-22 mins $Therapeutic Exercise: 8-22 mins                    G Codes:      Sharalyn InkJason D Shama Monfils PT, DPT     Letizia Hook 02/10/2018, 3:08 PM

## 2018-02-10 NOTE — Care Management Note (Signed)
Case Management Note  Patient Details  Name: Alyssa Hughes MRN: [id number] Date of Birth: 07/29/1961  Subjective/Objective: POD # 1 left total knee arthroplasty. Met with patient at bedside to discuss discharge planning. She lives at home with her spouse who will be her care giver. She will need  A walker and bedside commode. [facility]dered from [facility] with [facility]. Offered list of home health agencies. Referral to Jerry with [facility]. He verified that the [place] office can accept patient for HHPT. Pharmacy: [facility] [place] [phone]. Called Lovenox 40 mg # 14 no refills per Dr. Menz.                    Action/Plan: [facility] for HHPT, [facility] for walker and BSC. Cost of Lovenox is $ 47.00. Patient updated and denies issues paying for medication.   Expected Discharge Date:                  Expected Discharge Plan:  Home w Home Health Services  In-House Referral:     Discharge planning Services  CM Consult  Post Acute Care Choice:  Durable Medical Equipment, Home Health Choice offered to:  Patient  DME Arranged:  Bedside commode, Walker rolling DME Agency:  [facility]  HH Arranged:  PT HH Agency:  [facility]  Status of Service:  In process, will continue to follow  If discussed at Long Length of Stay Meetings, dates discussed:    Additional Comments:  Lisa M Jacobs, RN 02/10/2018, 3:03 PM  

## 2018-02-10 NOTE — Progress Notes (Signed)
   Subjective: 1 Day Post-Op Procedure(s) (LRB): TOTAL KNEE ARTHROPLASTY (Left) Patient reports pain as 6 on 0-10 scale.   Patient is well, and has had no acute complaints or problems Denies any N/V, CP, SOB, ABD pain. We will continue therapy today.  Plan is to go home with HHPT  Objective: Vital signs in last 24 hours: Temp:  [97 F (36.1 C)-99.2 F (37.3 C)] 99.2 F (37.3 C) (03/19 2352) Pulse Rate:  [74-101] 101 (03/19 2352) Resp:  [0-23] 19 (03/19 2352) BP: (110-135)/(66-87) 132/73 (03/19 2352) SpO2:  [98 %-100 %] 100 % (03/19 2352) Weight:  [104.3 kg (230 lb)] 104.3 kg (230 lb) (03/19 0851)  Intake/Output from previous day: 03/19 0701 - 03/20 0700 In: 2930 [I.V.:2780; IV Piggyback:150] Out: 1700 [Urine:1600; Blood:100] Intake/Output this shift: No intake/output data recorded.  Recent Labs    02/09/18 1350  HGB 11.8*   Recent Labs    02/09/18 1350  WBC 7.6  RBC 3.77*  HCT 35.7  PLT 299   Recent Labs    02/09/18 1350  CREATININE 1.05*   No results for input(s): LABPT, INR in the last 72 hours.  EXAM General - Patient is Alert, Appropriate and Oriented Extremity - Neurovascular intact Sensation intact distally Intact pulses distally Dorsiflexion/Plantar flexion intact No cellulitis present Compartment soft Dressing - dressing C/D/I and no drainage Motor Function - intact, moving foot and toes well on exam.   Past Medical History:  Diagnosis Date  . Anemia   . Anxiety   . Arthritis   . Dysrhythmia   . Hypothyroidism   . Neuromuscular disorder (HCC)     Assessment/Plan:   1 Day Post-Op Procedure(s) (LRB): TOTAL KNEE ARTHROPLASTY (Left) Active Problems:   Status post total knee replacement using cement, left  Estimated body mass index is 32.08 kg/m as calculated from the following:   Height as of this encounter:  (1.803 m).   Weight as of this encounter: 104.3 kg (230 lb). Advance diet Up with therapy  Needs BM Recheck labs in  the am Pain and VSS stable CM to assist with discharge to home with HHPT  DVT Prophylaxis - Lovenox, Foot Pumps and TED hose Weight-Bearing as tolerated to left leg   T. Cranston Neighbor, PA-C [facility] 02/10/2018, 7:39 AM

## 2018-02-10 NOTE — Progress Notes (Signed)
Clinical Social Worker (CSW) received SNF consult. PT is recommending home health. RN case manager aware of above. Please reconsult if future social work needs arise. CSW signing off.   Cinsere Mizrahi, LCSW [phone] 

## 2018-02-10 NOTE — Anesthesia Postprocedure Evaluation (Signed)
Anesthesia Post Note  Patient: Alyssa FosterLinda Hughes  Procedure(s) Performed: TOTAL KNEE ARTHROPLASTY (Left )  Patient location during evaluation: Nursing Unit Anesthesia Type: Spinal Level of consciousness: oriented and awake and alert Pain management: pain level controlled Vital Signs Assessment: post-procedure vital signs reviewed and stable Respiratory status: spontaneous breathing, respiratory function stable and nonlabored ventilation Cardiovascular status: blood pressure returned to baseline and stable Postop Assessment: no headache and no backache Anesthetic complications: no     Last Vitals:  Vitals:   02/09/18 2015 02/09/18 2352  BP: 135/77 132/73  Pulse: 98 (!) 101  Resp: 19 19  Temp: 37.3 C 37.3 C  SpO2: 99% 100%    Last Pain:  Vitals:   02/10/18 0645  TempSrc:   PainSc: 10-Worst pain ever                 Corrina Steffensen

## 2018-02-11 LAB — CBC
HCT: 34.4 % — ABNORMAL LOW (ref 35.0–47.0)
Hemoglobin: 11.2 g/dL — ABNORMAL LOW (ref 12.0–16.0)
MCH: 30.6 pg (ref 26.0–34.0)
MCHC: 32.5 g/dL (ref 32.0–36.0)
MCV: 94 fL (ref 80.0–100.0)
Platelets: 262 10*3/uL (ref 150–440)
RBC: 3.65 MIL/uL — ABNORMAL LOW (ref 3.80–5.20)
RDW: 14 % (ref 11.5–14.5)
WBC: 11.6 10*3/uL — ABNORMAL HIGH (ref 3.6–11.0)

## 2018-02-11 LAB — BASIC METABOLIC PANEL
Anion gap: 8 (ref 5–15)
BUN: 12 mg/dL (ref 6–20)
CO2: 26 mmol/L (ref 22–32)
Calcium: 9 mg/dL (ref 8.9–10.3)
Chloride: 102 mmol/L (ref 101–111)
Creatinine, Ser: 0.96 mg/dL (ref 0.44–1.00)
GFR calc Af Amer: >60 mL/min (ref >60)
GFR calc non Af Amer: >60 mL/min (ref >60)
Glucose, Bld: 121 mg/dL — ABNORMAL HIGH (ref 65–99)
Potassium: 4.1 mmol/L (ref 3.5–5.1)
Sodium: 136 mmol/L (ref 135–145)

## 2018-02-11 MED ORDER — METHOCARBAMOL 500 MG PO TABS: 500.0000 mg | ORAL_TABLET | Freq: Four times a day (QID) | ORAL | 0 refills | Status: AC | PRN

## 2018-02-11 MED ORDER — HYDROCODONE-ACETAMINOPHEN 7.5-325 MG PO TABS: 1.0000 | ORAL_TABLET | ORAL | 0 refills | Status: DC | PRN

## 2018-02-11 MED ORDER — ENOXAPARIN SODIUM 40 MG/0.4ML ~~LOC~~ SOLN: 40.0000 mg | SUBCUTANEOUS | 0 refills | Status: DC

## 2018-02-11 MED ORDER — FLEET ENEMA 7-19 GM/118ML RE ENEM
1.0000 | ENEMA | Freq: Once | RECTAL | Status: AC
  Administered 2018-02-11: 1 via RECTAL

## 2018-02-11 MED ORDER — SENNOSIDES-DOCUSATE SODIUM 8.6-50 MG PO TABS: 1.0000 | ORAL_TABLET | Freq: Every evening | ORAL | Status: AC | PRN

## 2018-02-11 NOTE — Discharge Summary (Signed)
Physician Discharge Summary  Patient ID: Alyssa Hughes MRN: [id number] DOB/AGE: [birth year] 57 y.o.  Admit date: 02/09/2018 Discharge date: 02/11/2018  Admission Diagnoses:  PRIMARY LOCALIZED OSTEOARTHRITIS OF LEFT KNEE   Discharge Diagnoses: Patient Active Problem List   Diagnosis Date Noted  . Status post total knee replacement using cement, left 02/09/2018    Past Medical History:  Diagnosis Date  . Anemia   . Anxiety   . Arthritis   . Dysrhythmia   . Hypothyroidism   . Neuromuscular disorder [facility])      Transfusion: none   Consultants (if any):   Discharged Condition: Improved  Hospital Course: Alyssa Hughes is an 57 y.o. female who was admitted 02/09/2018 with a diagnosis of knee osteoarthritis and went to the operating room on 02/09/2018 and underwent the above named procedures.    Surgeries: Procedure(s): TOTAL KNEE ARTHROPLASTY on 02/09/2018 Patient tolerated the surgery well. Taken to PACU where she was stabilized and then transferred to the orthopedic floor.  Started on Lovenox 30 mg q 12 hrs. Foot pumps applied bilaterally at 80 mm. Heels elevated on bed with rolled towels. No evidence of DVT. Negative Homan. Physical therapy started on day #1 for gait training and transfer. OT started day #1 for ADL and assisted devices.  Patient's foley was d/c on day #1. Patient's IV  was d/c on day #2.  On post op day #2 patient was stable and ready for discharge to home with HHPT.  Implants: Medacta GMK 4+ femur, 4 tibia left, 10 mm insert with short stem and tibial component and 2 patella all components cemented    She was given perioperative antibiotics:  Anti-infectives (From admission, onward)   Start     Dose/Rate Route Frequency Ordered Stop   02/09/18 1315  clindamycin (CLEOCIN) IVPB 900 mg     900 mg 100 mL/hr over 30 Minutes Intravenous Every 6 hours 02/09/18 1301 02/10/18 1005   02/09/18 0856  clindamycin (CLEOCIN) 900 MG/50ML IVPB    Note to Pharmacy:  Mikey Bussing   : cabinet override      02/09/18 0856 02/09/18 2114   02/08/18 2215  clindamycin (CLEOCIN) IVPB 900 mg     900 mg 100 mL/hr over 30 Minutes Intravenous  Once 02/08/18 2201 02/09/18 0955    .  She was given sequential compression devices, early ambulation, and Lovenox for DVT prophylaxis.  She benefited maximally from the hospital stay and there were no complications.    Recent vital signs:  Vitals:   02/11/18 0017 02/11/18 0806  BP: (!) 142/81 (!) 142/75  Pulse: (!) 105 (!) 102  Resp: 17 18  Temp: 98.5 F (36.9 C) 99 F (37.2 C)  SpO2: 96% 94%    Recent laboratory studies:  Lab Results  Component Value Date   HGB 11.2 (L) 02/11/2018   HGB 11.8 (L) 02/09/2018   HGB 12.3 01/27/2018   Lab Results  Component Value Date   WBC 11.6 (H) 02/11/2018   PLT 262 02/11/2018   Lab Results  Component Value Date   INR 1.00 01/27/2018   Lab Results  Component Value Date   NA 136 02/11/2018   K 4.1 02/11/2018   CL 102 02/11/2018   CO2 26 02/11/2018   BUN 12 02/11/2018   CREATININE 0.96 02/11/2018   GLUCOSE 121 (H) 02/11/2018    Discharge Medications:   Allergies as of 02/11/2018      Reactions   Penicillins Hives, Rash, Other (See Comments)   Has  patient had a PCN reaction causing immediate rash, facial/tongue/throat swelling, SOB or lightheadedness with hypotension: Yes Has patient had a PCN reaction causing severe rash involving mucus membranes or skin necrosis: Yes Has patient had a PCN reaction that required hospitalization: Unknown Has patient had a PCN reaction occurring within the last 10 years: No If all of the above answers are "NO", then may proceed with Cephalosporin use.      Medication List    STOP taking these medications   etodolac 400 MG tablet Commonly known as:  LODINE   LORCET 5-325 MG tablet Generic drug:  HYDROcodone-acetaminophen Replaced by:  HYDROcodone-acetaminophen 7.5-325 MG tablet     TAKE these medications   citalopram 20 MG  tablet Commonly known as:  CELEXA Take 20 mg by mouth daily.   enoxaparin 40 MG/0.4ML injection Commonly known as:  LOVENOX Inject 0.4 mLs (40 mg total) into the skin daily for 14 days.   fluticasone 50 MCG/ACT nasal spray Commonly known as:  FLONASE Place 2 sprays into both nostrils daily as needed for allergies.   gabapentin 300 MG capsule Commonly known as:  NEURONTIN Take 300 mg by mouth 4 (four) times daily.   HYDROcodone-acetaminophen 7.5-325 MG tablet Commonly known as:  NORCO Take 1-2 tablets by mouth every 4 (four) hours as needed for severe pain (pain score 7-10). Replaces:  LORCET 5-325 MG tablet   levothyroxine 125 MCG tablet Commonly known as:  SYNTHROID, LEVOTHROID Take 125 mcg by mouth daily.   methocarbamol 500 MG tablet Commonly known as:  ROBAXIN Take 1 tablet (500 mg total) by mouth every 6 (six) hours as needed for muscle spasms.   ONE-A-DAY 50 PLUS PO Take 1 tablet by mouth daily.   senna-docusate 8.6-50 MG tablet Commonly known as:  Senokot-S Take 1 tablet by mouth at bedtime as needed for mild constipation.            Durable Medical Equipment  (From admission, onward)        Start     Ordered   02/09/18 1302  DME Bedside commode  Once    Question:  Patient needs a bedside commode to treat with the following condition  Answer:  Status post total knee replacement using cement, left   02/09/18 1301   02/09/18 1302  DME 3 n 1  Once     02/09/18 1301   02/09/18 1302  DME Walker rolling  Once    Question:  Patient needs a walker to treat with the following condition  Answer:  Status post total knee replacement using cement, left   02/09/18 1301      Diagnostic Studies: Dg Knee 1-2 Views Left  Result Date: 02/09/2018 CLINICAL DATA:  Status post left total knee joint prosthesis placement. EXAM: LEFT KNEE - 1-2 VIEW COMPARISON:  CT scan of the left knee of December 30, 2017 FINDINGS: The patient has undergone total left knee joint prosthesis  placement. Radiographic positioning of the prosthetic components is good. The interface with the native bone is normal. There is air in fluid in the anterior aspect of the joint space. Skin staples are present anteriorly. IMPRESSION: No immediate postprocedure complication following left total knee joint prosthesis placement. Electronically Signed   By: David  SwazilandJordan M.D.   On: 02/09/2018 12:00    Disposition:     Follow-up Information    Kennedy BuckerMenz, Michael, MD Follow up in 2 week(s).   Specialty:  Orthopedic Surgery Contact information: [address] [facility]  [place] [address] [phone]            Signed: Amador Cunas CHRISTOPHER 02/11/2018, 12:08 PM

## 2018-02-11 NOTE — Discharge Instructions (Signed)

## 2018-02-11 NOTE — Progress Notes (Signed)
   Subjective: 2 Days Post-Op Procedure(s) (LRB): TOTAL KNEE ARTHROPLASTY (Left) Patient reports pain as 6 on 0-10 scale.   Patient is well, and has had no acute complaints or problems Denies any N/V, CP, SOB, ABD pain. We will continue therapy today.  Plan is to go home with HHPT  Objective: Vital signs in last 24 hours: Temp:  [98.5 F (36.9 C)-99 F (37.2 C)] 99 F (37.2 C) (03/21 0806) Pulse Rate:  [102-105] 102 (03/21 0806) Resp:  [17-20] 18 (03/21 0806) BP: (140-142)/(72-81) 142/75 (03/21 0806) SpO2:  [94 %-98 %] 94 % (03/21 0806)  Intake/Output from previous day: 03/20 0701 - 03/21 0700 In: 1580 [P.O.:480; I.V.:1100] Out: -  Intake/Output this shift: No intake/output data recorded.  Recent Labs    02/09/18 1350 02/11/18 0325  HGB 11.8* 11.2*   Recent Labs    02/09/18 1350 02/11/18 0325  WBC 7.6 11.6*  RBC 3.77* 3.65*  HCT 35.7 34.4*  PLT 299 262   Recent Labs    02/09/18 1350 02/11/18 0325  NA  --  136  K  --  4.1  CL  --  102  CO2  --  26  BUN  --  12  CREATININE 1.05* 0.96  GLUCOSE  --  121*  CALCIUM  --  9.0   No results for input(s): LABPT, INR in the last 72 hours.  EXAM General - Patient is Alert, Appropriate and Oriented Extremity - Neurovascular intact Sensation intact distally Intact pulses distally Dorsiflexion/Plantar flexion intact No cellulitis present Compartment soft Dressing - dressing C/D/I and no drainage Motor Function - intact, moving foot and toes well on exam.   Past Medical History:  Diagnosis Date  . Anemia   . Anxiety   . Arthritis   . Dysrhythmia   . Hypothyroidism   . Neuromuscular disorder (HCC)     Assessment/Plan:   2 Days Post-Op Procedure(s) (LRB): TOTAL KNEE ARTHROPLASTY (Left) Active Problems:   Status post total knee replacement using cement, left  Estimated body mass index is 32.08 kg/m as calculated from the following:   Height as of this encounter: 5\' 11"  (1.803 m).   Weight as of this  encounter: 104.3 kg (230 lb). Advance diet Up with therapy  Needs BM Labs stable Pain and VSS stable CM to assist with discharge to home with HHPT. Discharge today pending good progress with with PT and BM  DVT Prophylaxis - Lovenox, Foot Pumps and TED hose Weight-Bearing as tolerated to left leg   T. Cranston Neighborhris Tayron Hunnell, PA-C [facility] 02/11/2018, 8:56 AM

## 2018-02-11 NOTE — Progress Notes (Signed)
Pt was able to have a BM after fleets enema administered. PIV removed, VSS. Discharge instructions and prescriptions reviewed with pt, all questions answered. Pt met PT goals today, equipment has been delivered to pt's room. Pt assisted to personal vehicle by NT.   [place], Jerry Caras

## 2018-02-11 NOTE — Progress Notes (Signed)
Pt has been unable to have a BM today despite having magnesium citrate and a dulcolax suppository. Pt has attempted multiple times in the bathroom, but has been unsuccessful. Dr. Rosita KeaMenz notified, received order for fleets enema.   [place], Latricia HeftKorie G

## 2018-02-11 NOTE — Progress Notes (Signed)
Physical Therapy Treatment Patient Details Name: Alyssa FosterLinda Hughes MRN: [id number] DOB: 07/11/1961 Today's Date: 02/11/2018    History of Present Illness Pt underwent L TKR with post-op nausea and vomiting which prohibiting therapy on POD#1. PMH includes anxiety, OA, hypothyroidism, and obesity    PT Comments    Pt is making great progress with therapy. She is able to ambulate to rehab gym, to RN station, and then back to bed. Cues and education for proper sequencing with rolling walker. Cues to improve L heel strike, bilateral step length, and decrease bilateral UE support on walker. Assisted pt in pushing walker farther out in front of body. HR increases to 143 during ambulation and SaO2 at 97%. Pt denies chest pain, palpitations, or DOE. She is able to safely complete stair training and perform seated exercises with therapist. Pt has not completed a full lap but is able to walk functional distances for full household and limited community mobility. She completed stair training. Pt is safe to discharge home with family support [facility]. Pt will benefit from PT services to address deficits in strength, balance, and mobility in order to return to full function at home.    Follow Up Recommendations  Home health PT     Equipment Recommendations  Rolling walker with 5" wheels;3in1 (PT)    Recommendations for Other Services OT consult     Precautions / Restrictions Precautions Precautions: Fall;Knee Precaution Booklet Issued: Yes (comment) Required Braces or Orthoses: Knee Immobilizer - Left Knee Immobilizer - Left: Discontinue once straight leg raise with < 10 degree lag Restrictions Weight Bearing Restrictions: Yes RLE Weight Bearing: Weight bearing as tolerated    Mobility  Bed Mobility               General bed mobility comments: Received and left upright in recliner  Transfers Overall transfer level: Needs assistance Equipment used: Rolling walker  (2 wheeled) Transfers: Sit to/from Stand Sit to Stand: Min guard         General transfer comment: Cues for safe hand placement. Decreased weight shifting to LLE, good RLE strength. Increased time and effort required to come to standing but slightly improved from yesterday  Ambulation/Gait Ambulation/Gait assistance: Min guard Ambulation Distance (Feet): 150 Feet Assistive device: Rolling walker (2 wheeled)     Gait velocity interpretation: <1.8 ft/sec, indicative of risk for recurrent falls General Gait Details: Pt is able to ambulate to rehab gym, to RN station, and then back to bed. Cues and education for proper sequencing with rolling walker. Cues to improve L heel strike, bilateral step length, and decrease bilateral UE support on walker. Assisted pt in pushing walker farther out in front of body. HR increases to 143 during ambulation and SaO2 at 97%. Pt denies chest pain, palpitations, or DOE.    Stairs Stairs: Yes   Stair Management: Two rails;Step to pattern Number of Stairs: 4 General stair comments: Pt provided education for proper stair training. Cues for sequencing. She demonstrates good safety and stability during stair training  Wheelchair Mobility    Modified Rankin (Stroke Patients Only)       Balance Overall balance assessment: Needs assistance Sitting-balance support: No upper extremity supported Sitting balance-Leahy Scale: Good     Standing balance support: Bilateral upper extremity supported Standing balance-Leahy Scale: Fair                              Cognition Arousal/Alertness:  Awake/alert Behavior During Therapy: WFL for tasks assessed/performed Overall Cognitive Status: Within Functional Limits for tasks assessed                                        Exercises Total Joint Exercises Ankle Circles/Pumps: Both;10 reps Quad Sets: Both;10 reps Gluteal Sets: Both;10 reps Heel Slides: Left;10 reps Long Arc Quad:  Left;10 reps Knee Flexion: Left;10 reps Goniometric ROM: -13 to 86 degrees AAROM pain limited in both flexion and extension Marching in Standing: Both;10 reps;Seated    General Comments        Pertinent Vitals/Pain Pain Assessment: 0-10 Pain Score: 6  Pain Location: L knee Pain Descriptors / Indicators: Operative site guarding Pain Intervention(s): Monitored during session;Premedicated before session    Home Living                      Prior Function            PT Goals (current goals can now be found in the care plan section) Acute Rehab PT Goals Patient Stated Goal: Return to prior level of function PT Goal Formulation: With patient Time For Goal Achievement: 02/24/18 Potential to Achieve Goals: Good Progress towards PT goals: Progressing toward goals    Frequency    BID      PT Plan Current plan remains appropriate    Co-evaluation              AM-PAC PT "6 Clicks" Daily Activity  Outcome Measure  Difficulty turning over in bed (including adjusting bedclothes, sheets and blankets)?: A Little Difficulty moving from lying on back to sitting on the side of the bed? : A Little Difficulty sitting down on and standing up from a chair with arms (e.g., wheelchair, bedside commode, etc,.)?: A Little Help needed moving to and from a bed to chair (including a wheelchair)?: A Little Help needed walking in hospital room?: A Little Help needed climbing 3-5 steps with a railing? : A Little 6 Click Score: 18    End of Session Equipment Utilized During Treatment: Gait belt Activity Tolerance: Patient tolerated treatment well Patient left: with call bell/phone within reach;with SCD's reapplied;Other (comment);in chair;with chair alarm set(towel roll under heel and polar care in place) Nurse Communication: Mobility status PT Visit Diagnosis: Unsteadiness on feet (R26.81);Muscle weakness (generalized) (M62.81);Difficulty in walking, not elsewhere classified  (R26.2);Pain Pain - Right/Left: Left Pain - part of body: Knee     Time: 6644-0347 PT Time Calculation (min) (ACUTE ONLY): 24 min  Charges:  $Gait Training: 8-22 mins $Therapeutic Exercise: 8-22 mins                    G Codes:      Sharalyn Ink Loney Peto PT, DPT     Garry Bochicchio 02/11/2018, 11:03 AM

## 2018-02-11 NOTE — Progress Notes (Signed)
Physical Therapy Treatment Patient Details Name: Alyssa FosterLinda Hughes MRN: [id number] DOB: 07/03/1961 Today's Date: 02/11/2018    History of Present Illness Pt underwent L TKR with post-op nausea and vomiting which prohibiting therapy on POD#1. PMH includes anxiety, OA, hypothyroidism, and obesity    PT Comments    Deferred bed mobility, transfers, and ambulation as pt is trying to have a bowel movement and RN is bringing an enema. Pt is able to complete all supine exercises as instructed. She is safe to discharge home when medically indicated. Pt will benefit from PT services to address deficits in strength, balance, and mobility in order to return to full function at home.   Follow Up Recommendations  Home health PT     Equipment Recommendations  Rolling walker with 5" wheels;3in1 (PT)    Recommendations for Other Services OT consult     Precautions / Restrictions Precautions Precautions: Fall;Knee Precaution Booklet Issued: Yes (comment) Required Braces or Orthoses: Knee Immobilizer - Left Knee Immobilizer - Left: Discontinue once straight leg raise with < 10 degree lag Restrictions Weight Bearing Restrictions: Yes RLE Weight Bearing: Weight bearing as tolerated    Mobility  Bed Mobility               General bed mobility comments: Deferred bed mobility, transfers, and ambulation as pt is trying to have a bowel movement and RN is bringing an enema  Transfers                    Ambulation/Gait                 Stairs            Wheelchair Mobility    Modified Rankin (Stroke Patients Only)       Balance                                            Cognition Arousal/Alertness: Awake/alert Behavior During Therapy: WFL for tasks assessed/performed Overall Cognitive Status: Within Functional Limits for tasks assessed                                        Exercises Total Joint Exercises Ankle Circles/Pumps:  Both;10 reps Quad Sets: Both;10 reps Gluteal Sets: Both;10 reps Towel Squeeze: Both;10 reps Short Arc Quad: Left;10 reps Heel Slides: Left;10 reps Hip ABduction/ADduction: Left;10 reps Straight Leg Raises: Left;10 reps    General Comments        Pertinent Vitals/Pain Pain Assessment: 0-10 Pain Score: 6  Pain Location: L knee Pain Descriptors / Indicators: Operative site guarding Pain Intervention(s): Monitored during session    Home Living                      Prior Function            PT Goals (current goals can now be found in the care plan section) Acute Rehab PT Goals Patient Stated Goal: Return to prior level of function PT Goal Formulation: With patient Time For Goal Achievement: 02/24/18 Potential to Achieve Goals: Good Progress towards PT goals: Progressing toward goals    Frequency    BID      PT Plan Current plan remains appropriate    Co-evaluation  AM-PAC PT "6 Clicks" Daily Activity  Outcome Measure  Difficulty turning over in bed (including adjusting bedclothes, sheets and blankets)?: A Little Difficulty moving from lying on back to sitting on the side of the bed? : A Little Difficulty sitting down on and standing up from a chair with arms (e.g., wheelchair, bedside commode, etc,.)?: A Little Help needed moving to and from a bed to chair (including a wheelchair)?: A Little Help needed walking in hospital room?: A Little Help needed climbing 3-5 steps with a railing? : A Little 6 Click Score: 18    End of Session   Activity Tolerance: Patient tolerated treatment well Patient left: with call bell/phone within reach;in bed;with bed alarm set;Other (comment)(towel roll under heel)   PT Visit Diagnosis: Unsteadiness on feet (R26.81);Muscle weakness (generalized) (M62.81);Difficulty in walking, not elsewhere classified (R26.2);Pain Pain - Right/Left: Left Pain - part of body: Knee     Time: 1610-9604 PT Time  Calculation (min) (ACUTE ONLY): 8 min  Charges:  $Therapeutic Exercise: 8-22 mins                    G Codes:       Sharalyn Ink Nakina Spatz PT, DPT     Kirin Pastorino 02/11/2018, 5:28 PM

## 2018-02-11 NOTE — Care Management (Signed)
RNCM met with patient to discuss readiness for discharge.  She plans for home health through [facility]. [facility] delivered bedside commode and rolling walker. She is aware of cost for Lovenox $40. She denies any other RNCM needs. She is waiting on BM.

## 2018-02-16 ENCOUNTER — Telehealth: Payer: Self-pay

## 2018-02-16 NOTE — Telephone Encounter (Signed)
EMMI Follow-up: Called patient to see what questions she had and if she received her discharge paperwork.  She had received her discharge paperwork, got Rx's filled today and had no other concerns.

## 2018-02-19 ENCOUNTER — Telehealth: Payer: Self-pay | Admitting: Licensed Clinical Social Worker

## 2018-02-19 NOTE — Telephone Encounter (Signed)
EMMI flagged patient for answering yes to feeling sad/hopeless/anxious/empty. Clinical Child psychotherapistocial Worker (CSW) was able to reach patient via telephone. Patient reported that she was feeling sad the day of the phone call because of her pain level. Patient reported that her MD has called in stronger pain medication and she will continue PT. Patient reported that she is not depressed and is not having suicidal ideations. Patient reported no needs or concerns. No future call is needed.   Baker Hughes IncorporatedBailey Brandan Robicheaux, LCSW [phone]

## 2018-03-15 ENCOUNTER — Other Ambulatory Visit: Payer: Self-pay

## 2018-03-15 ENCOUNTER — Encounter
Admission: RE | Admit: 2018-03-15 | Discharge: 2018-03-15 | Disposition: A | Discharge status: Home / Self Care | Payer: Medicare Other | Source: Ambulatory Visit | Attending: Orthopedic Surgery | Admitting: Orthopedic Surgery

## 2018-03-15 ENCOUNTER — Encounter: Payer: Self-pay | Admitting: *Deleted

## 2018-03-15 NOTE — Patient Instructions (Signed)
Your procedure is scheduled on: 03/16/18 Report to Day Surgery. [facility] To find out your arrival time please call [phone] between 1PM - 3PM on 03/15/18   Remember: Instructions that are not followed completely may result in serious medical risk, up to and including death, or upon the discretion of your surgeon and anesthesiologist your surgery may need to be rescheduled.     _X__ 1. Do not eat food after midnight the night before your procedure.                 No gum chewing or hard candies. You may drink clear liquids up to 2 hours                 before you are scheduled to arrive for your surgery- DO not drink clear                 liquids within 2 hours of the start of your surgery.                 Clear Liquids include:  water, apple juice without pulp, clear carbohydrate                 drink such as Clearfast of Gartorade, Black Coffee or Tea (Do not add                 anything to coffee or tea).  __X__2.  On the morning of surgery brush your teeth with toothpaste and water, you                 may rinse your mouth with mouthwash if you wish.  Do not swallow any              toothpaste of mouthwash.     _X__ 3.  No Alcohol for 24 hours before or after surgery.   _X__ 4.  Do Not Smoke or use e-cigarettes For 24 Hours Prior to Your Surgery.                 Do not use any chewable tobacco products for at least 6 hours prior to                 surgery.  ____  5.  Bring all medications with you on the day of surgery if instructed.   _X  6.  Notify your doctor if there is any change in your medical condition      (cold, fever, infections).     Do not wear jewelry, make-up, hairpins, clips or nail polish. Do not wear lotions, powders, or perfumes. You may wear deodorant. Do not shave 48 hours prior to surgery. Men may shave face and neck. Do not bring valuables to the hospital.    [facility] is not responsible for any belongings or  valuables.  Contacts, dentures or bridgework may not be worn into surgery. Leave your suitcase in the car. After surgery it may be brought to your room. For patients admitted to the hospital, discharge time is determined by your treatment team.   Patients discharged the day of surgery will not be allowed to drive home.    __X__ Take these medicines the morning of surgery with A SIP OF WATER:    1.FLEXERIL  2. GABAPENTIN  3. LEVOTHYROXINE  4.CELEXA  5.  6.  ____ Fleet Enema (as directed)   ____ Use CHG Soap as directed  ____ Use inhalers on the day  of surgery  ____ Stop metformin 2 days prior to surgery    ____ Take 1/2 of usual insulin dose the night before surgery. No insulin the morning          of surgery.   ____ Stop Coumadin/Plavix/aspirin on ____ Stop Anti-inflammatories on    ____ Stop supplements until after surgery.    ____ Bring C-Pap to the hospital.

## 2018-03-16 ENCOUNTER — Other Ambulatory Visit: Payer: Self-pay

## 2018-03-16 ENCOUNTER — Ambulatory Visit
Admission: RE | Admit: 2018-03-16 | Discharge: 2018-03-16 | Disposition: A | Discharge status: Home / Self Care | Payer: Medicare Other | Source: Ambulatory Visit | Attending: Orthopedic Surgery | Admitting: Orthopedic Surgery

## 2018-03-16 ENCOUNTER — Ambulatory Visit: Payer: Medicare Other | Admitting: Anesthesiology

## 2018-03-16 ENCOUNTER — Encounter: Payer: Self-pay | Admitting: Anesthesiology

## 2018-03-16 ENCOUNTER — Encounter
Admission: RE | Disposition: A | Discharge status: Home / Self Care | Payer: Self-pay | Source: Ambulatory Visit | Attending: Orthopedic Surgery

## 2018-03-16 DIAGNOSIS — Z96652 Presence of left artificial knee joint: Secondary | ICD-10-CM | POA: Insufficient documentation

## 2018-03-16 DIAGNOSIS — Z791 Long term (current) use of non-steroidal anti-inflammatories (NSAID): Secondary | ICD-10-CM | POA: Insufficient documentation

## 2018-03-16 DIAGNOSIS — Z79891 Long term (current) use of opiate analgesic: Secondary | ICD-10-CM | POA: Diagnosis not present

## 2018-03-16 DIAGNOSIS — F419 Anxiety disorder, unspecified: Secondary | ICD-10-CM | POA: Insufficient documentation

## 2018-03-16 DIAGNOSIS — M24662 Ankylosis, left knee: Secondary | ICD-10-CM | POA: Insufficient documentation

## 2018-03-16 DIAGNOSIS — Z79899 Other long term (current) drug therapy: Secondary | ICD-10-CM | POA: Diagnosis not present

## 2018-03-16 DIAGNOSIS — E039 Hypothyroidism, unspecified: Secondary | ICD-10-CM | POA: Insufficient documentation

## 2018-03-16 HISTORY — PX: KNEE CLOSED REDUCTION: SHX995

## 2018-03-16 SURGERY — MANIPULATION, KNEE, CLOSED
Anesthesia: General | Laterality: Left

## 2018-03-16 MED ORDER — FENTANYL CITRATE (PF) 100 MCG/2ML IJ SOLN
25.0000 ug | INTRAMUSCULAR | Status: AC | PRN
  Administered 2018-03-16 (×6): 25 ug via INTRAVENOUS

## 2018-03-16 MED ORDER — FAMOTIDINE 20 MG PO TABS
ORAL_TABLET | ORAL | Status: AC
  Administered 2018-03-16: 20 mg via ORAL
  Filled 2018-03-16: qty 1

## 2018-03-16 MED ORDER — LACTATED RINGERS IV SOLN
INTRAVENOUS | Status: DC
  Administered 2018-03-16: 13:00:00 via INTRAVENOUS

## 2018-03-16 MED ORDER — PROPOFOL 10 MG/ML IV BOLUS
INTRAVENOUS | Status: AC
  Filled 2018-03-16: qty 20

## 2018-03-16 MED ORDER — FENTANYL CITRATE (PF) 100 MCG/2ML IJ SOLN
INTRAMUSCULAR | Status: AC
  Administered 2018-03-16: 25 ug via INTRAVENOUS
  Filled 2018-03-16: qty 2

## 2018-03-16 MED ORDER — LIDOCAINE HCL (PF) 2 % IJ SOLN
INTRAMUSCULAR | Status: AC
  Filled 2018-03-16: qty 10

## 2018-03-16 MED ORDER — ONDANSETRON HCL 4 MG/2ML IJ SOLN: 4.0000 mg | Freq: Once | INTRAMUSCULAR | Status: DC | PRN

## 2018-03-16 MED ORDER — SUCCINYLCHOLINE 20MG/ML (10ML) SYRINGE FOR MEDFUSION PUMP - OPTIME
INTRAMUSCULAR | Status: DC | PRN
  Administered 2018-03-16: 20 mg via INTRAVENOUS

## 2018-03-16 MED ORDER — OXYCODONE HCL 5 MG PO TABS
5.0000 mg | ORAL_TABLET | ORAL | Status: DC | PRN
  Administered 2018-03-16: 5 mg via ORAL

## 2018-03-16 MED ORDER — FENTANYL CITRATE (PF) 100 MCG/2ML IJ SOLN
INTRAMUSCULAR | Status: AC
  Filled 2018-03-16: qty 2

## 2018-03-16 MED ORDER — MIDAZOLAM HCL 2 MG/2ML IJ SOLN
INTRAMUSCULAR | Status: DC | PRN
  Administered 2018-03-16 (×2): 1 mg via INTRAVENOUS

## 2018-03-16 MED ORDER — FENTANYL CITRATE (PF) 100 MCG/2ML IJ SOLN
INTRAMUSCULAR | Status: DC | PRN
  Administered 2018-03-16 (×2): 50 ug via INTRAVENOUS

## 2018-03-16 MED ORDER — FAMOTIDINE 20 MG PO TABS
20.0000 mg | ORAL_TABLET | Freq: Once | ORAL | Status: AC
  Administered 2018-03-16: 20 mg via ORAL

## 2018-03-16 MED ORDER — MIDAZOLAM HCL 2 MG/2ML IJ SOLN
INTRAMUSCULAR | Status: AC
  Filled 2018-03-16: qty 2

## 2018-03-16 MED ORDER — OXYCODONE HCL 5 MG PO TABS
ORAL_TABLET | ORAL | Status: AC
  Administered 2018-03-16: 5 mg via ORAL
  Filled 2018-03-16: qty 1

## 2018-03-16 MED ORDER — PROPOFOL 10 MG/ML IV BOLUS
INTRAVENOUS | Status: DC | PRN
  Administered 2018-03-16: 150 mg via INTRAVENOUS

## 2018-03-16 SURGICAL SUPPLY — 1 items: KIT TURNOVER KIT A (KITS) ×3 IMPLANT

## 2018-03-16 NOTE — Anesthesia Post-op Follow-up Note (Signed)
Anesthesia QCDR form completed.        

## 2018-03-16 NOTE — Op Note (Signed)
03/16/2018  2:26 PM  PATIENT:  Alyssa FosterLinda Gelinas  57 y.o. 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  POST-OPERATIVE DIAGNOSIS:  STATUS POST TOTAL KNEE REPLACEMENT USING same  PROCEDURE:  Procedure(s): CLOSED MANIPULATION KNEE (Left)  SURGEON: Leitha SchullerMichael J Arilla Hice, MD  ASSISTANTS: None  ANESTHESIA:   general  EBL:  Total I/O In: 650 [I.V.:650] Out: -   BLOOD ADMINISTERED:none  DRAINS: none   LOCAL MEDICATIONS USED:  NONE  SPECIMEN:  No Specimen  DISPOSITION OF SPECIMEN:  N/A  COUNTS:  NO Closed procedure no counts taken is no instruments were opened  TOURNIQUET:  * No tourniquets in log *  IMPLANTS: None  DICTATION: .Dragon Dictation patient was brought to the operating room and after adequate anesthesia was obtained the appropriate patient identification and timeout procedure were completed.  Initial exam revealed range of motion of 30 to 70 degrees range of motion the knee was then brought out into extension and there was a problem and if adhesions and extension getting out to about 5 degrees of extension followed by bringing the knee up into flexion and putting direct pressure to the proximal tibia to the point where flexion be brought back to proximally 110 degrees there is popping of adhesions in both flexion and extension with good tracking of the patella.  PLAN OF CARE: Discharge to home after PACU  PATIENT DISPOSITION:  PACU - hemodynamically stable.

## 2018-03-16 NOTE — Transfer of Care (Signed)
Immediate Anesthesia Transfer of Care Note  Patient: Alyssa FosterLinda Hughes  Procedure(s) Performed: CLOSED MANIPULATION KNEE (Left )  Patient Location: PACU  Anesthesia Type:General  Level of Consciousness: awake  Airway & Oxygen Therapy: Patient Spontanous Breathing  Post-op Assessment: Report given to RN  Post vital signs: stable  Last Vitals:  Vitals Value Taken Time  BP 132/53 03/16/2018  2:28 PM  Temp 36.1 C 03/16/2018  2:28 PM  Pulse 82 03/16/2018  2:28 PM  Resp 19 03/16/2018  2:28 PM  SpO2 100 % 03/16/2018  2:28 PM  Vitals shown include unvalidated device data.  Last Pain:  Vitals:   03/16/18 1428  TempSrc: Tympanic  PainSc: 0-No pain         Complications: No apparent anesthesia complications

## 2018-03-16 NOTE — H&P (Signed)
Reviewed paper H+P, will be scanned into chart. No changes noted.  

## 2018-03-16 NOTE — Anesthesia Preprocedure Evaluation (Signed)
Anesthesia Evaluation  Patient identified by MRN, date of birth, ID band Patient awake    Reviewed: Allergy & Precautions, NPO status , Patient's Chart, lab work & pertinent test results, reviewed documented beta blocker date and time   Airway Mallampati: III  TM Distance: >3 FB     Dental  (+) Chipped   Pulmonary           Cardiovascular + dysrhythmias      Neuro/Psych Anxiety    GI/Hepatic   Endo/Other  Hypothyroidism   Renal/GU      Musculoskeletal  (+) Arthritis ,   Abdominal   Peds  Hematology  (+) anemia ,   Anesthesia Other Findings   Reproductive/Obstetrics                             Anesthesia Physical Anesthesia Plan  ASA: II  Anesthesia Plan: General   Post-op Pain Management:    Induction: Intravenous  PONV Risk Score and Plan:   Airway Management Planned:   Additional Equipment:   Intra-op Plan:   Post-operative Plan:   Informed Consent: I have reviewed the patients History and Physical, chart, labs and discussed the procedure including the risks, benefits and alternatives for the proposed anesthesia with the patient or authorized representative who has indicated his/her understanding and acceptance.     Plan Discussed with: CRNA  Anesthesia Plan Comments:         Anesthesia Quick Evaluation

## 2018-03-16 NOTE — Discharge Instructions (Addendum)
Work on work range of motion of the knee, flexion and extension, as much as possible   AMBULATORY SURGERY  DISCHARGE INSTRUCTIONS   1) The drugs that you were given will stay in your system until tomorrow so for the next 24 hours you should not:  A) Drive an automobile B) Make any legal decisions C) Drink any alcoholic beverage   2) You may resume regular meals tomorrow.  Today it is better to start with liquids and gradually work up to solid foods.  You may eat anything you prefer, but it is better to start with liquids, then soup and crackers, and gradually work up to solid foods.   3) Please notify your doctor immediately if you have any unusual bleeding, trouble breathing, redness and pain at the surgery site, drainage, fever, or pain not relieved by medication.    4) Additional Instructions:        Please contact your physician with any problems or Same Day Surgery at [phone], Monday through Friday 6 am to 4 pm, or [place] [facility] Main number at [phone].

## 2018-03-16 NOTE — Anesthesia Postprocedure Evaluation (Signed)
Anesthesia Post Note  Patient: Alyssa Hughes  Procedure(s) Performed: CLOSED MANIPULATION KNEE (Left )  Patient location during evaluation: PACU Anesthesia Type: General Level of consciousness: awake and alert Pain management: pain level controlled Vital Signs Assessment: post-procedure vital signs reviewed and stable Respiratory status: spontaneous breathing, nonlabored ventilation, respiratory function stable and patient connected to nasal cannula oxygen Cardiovascular status: blood pressure returned to baseline and stable Postop Assessment: no apparent nausea or vomiting Anesthetic complications: no     Last Vitals:  Vitals:   03/16/18 1528 03/16/18 1548  BP: (!) 141/88 (!) 150/87  Pulse: 78 73  Resp: 16 16  Temp: 36.9 C (!) 36.4 C  SpO2: 100% 100%    Last Pain:  Vitals:   03/16/18 1548  TempSrc: Temporal  PainSc: 5                  [name]

## 2018-03-17 ENCOUNTER — Encounter: Payer: Self-pay | Admitting: Orthopedic Surgery

## 2018-05-11 ENCOUNTER — Inpatient Hospital Stay: Admission: RE | Admit: 2018-05-11 | Payer: Medicare Other | Source: Ambulatory Visit

## 2018-05-12 ENCOUNTER — Other Ambulatory Visit: Payer: Self-pay

## 2018-05-12 ENCOUNTER — Encounter
Admission: RE | Admit: 2018-05-12 | Discharge: 2018-05-12 | Disposition: A | Discharge status: Home / Self Care | Payer: Medicare Other | Source: Ambulatory Visit | Attending: Orthopedic Surgery | Admitting: Orthopedic Surgery

## 2018-05-12 HISTORY — DX: Adverse effect of unspecified anesthetic, initial encounter: T41.45XA

## 2018-05-12 HISTORY — DX: Other complications of anesthesia, initial encounter: T88.59XA

## 2018-05-12 HISTORY — DX: Nausea with vomiting, unspecified: R11.2

## 2018-05-12 HISTORY — DX: Other specified postprocedural states: Z98.890

## 2018-05-12 NOTE — Patient Instructions (Signed)
Your procedure is scheduled on: 05-18-18 Report to Same Day Surgery 2nd floor medical mall [facility] Entrance-take elevator on left to 2nd floor.  Check in with surgery information desk.) To find out your arrival time please call [phone] between 1PM - 3PM on 05-17-18  Remember: Instructions that are not followed completely may result in serious medical risk, up to and including death, or upon the discretion of your surgeon and anesthesiologist your surgery may need to be rescheduled.    _x___ 1. Do not eat food after midnight the night before your procedure. NO GUM OR CANDY AFTER MIDNIGHT.  You may drink clear liquids up to 2 hours before you are scheduled to arrive at the hospital for your procedure.  Do not drink clear liquids within 2 hours of your scheduled arrival to the hospital.  Clear liquids include  --Water or Apple juice without pulp  --Clear carbohydrate beverage such as ClearFast or Gatorade  --Black Coffee or Clear Tea (No milk, no creamers, do not add anything to the coffee or Tea     __x__ 2. No Alcohol for 24 hours before or after surgery.   __x__3. No Smoking or e-cigarettes for 24 prior to surgery.  Do not use any chewable tobacco products for at least 6 hour prior to surgery   ____  4. Bring all medications with you on the day of surgery if instructed.    __x__ 5. Notify your doctor if there is any change in your medical condition     (cold, fever, infections).    x___6. On the morning of surgery brush your teeth with toothpaste and water.  You may rinse your mouth with mouth wash if you wish.  Do not swallow any toothpaste or mouthwash.   Do not wear jewelry, make-up, hairpins, clips or nail polish.  Do not wear lotions, powders, or perfumes. You may wear deodorant.  Do not shave 48 hours prior to surgery. Men may shave face and neck.  Do not bring valuables to the hospital.    [facility] is not responsible for any belongings or valuables.    Contacts, dentures or bridgework may not be worn into surgery.  Leave your suitcase in the car. After surgery it may be brought to your room.  For patients admitted to the hospital, discharge time is determined by your treatment team.  _  Patients discharged the day of surgery will not be allowed to drive home.  You will need someone to drive you home and stay with you the night of your procedure.    Please read over the following fact sheets that you were given:   [facility] Preparing for Surgery and or MRSA Information   _x___ Take anti-hypertensive listed below, cardiac, seizure, asthma, anti-reflux and psychiatric medicines. These include:  1. GABAPENTIN  2. LEVOTHYROXINE  3.  4.  5.  6.  ____Fleets enema or Magnesium Citrate as directed.   _x___ Use CHG Soap or sage wipes as directed on instruction sheet   ____ Use inhalers on the day of surgery and bring to hospital day of surgery  ____ Stop Metformin and Janumet 2 days prior to surgery.    ____ Take 1/2 of usual insulin dose the night before surgery and none on the morning surgery.   ____ Follow recommendations from Cardiologist, Pulmonologist or PCP regarding stopping Aspirin, Coumadin, Plavix ,Eliquis, Effient, or Pradaxa, and Pletal.  X____Stop Anti-inflammatories such as Advil, Aleve, Ibuprofen, Motrin, Naproxen, Naprosyn, Goodies powders or aspirin  products NOW-OK to take Tylenol OR OXYCODONE IF NEEDED   ____ Stop supplements until after surgery.     ____ Bring C-Pap to the hospital.

## 2018-05-18 ENCOUNTER — Ambulatory Visit: Payer: Medicare Other | Admitting: Anesthesiology

## 2018-05-18 ENCOUNTER — Encounter
Admission: RE | Disposition: A | Discharge status: Home / Self Care | Payer: Self-pay | Source: Ambulatory Visit | Attending: Orthopedic Surgery

## 2018-05-18 ENCOUNTER — Encounter: Payer: Self-pay | Admitting: *Deleted

## 2018-05-18 ENCOUNTER — Ambulatory Visit
Admission: RE | Admit: 2018-05-18 | Discharge: 2018-05-19 | Disposition: A | Discharge status: Home / Self Care | Payer: Medicare Other | Source: Ambulatory Visit | Attending: Orthopedic Surgery | Admitting: Orthopedic Surgery

## 2018-05-18 ENCOUNTER — Other Ambulatory Visit: Payer: Self-pay

## 2018-05-18 DIAGNOSIS — T8482XS Fibrosis due to internal orthopedic prosthetic devices, implants and grafts, sequela: Secondary | ICD-10-CM

## 2018-05-18 DIAGNOSIS — D649 Anemia, unspecified: Secondary | ICD-10-CM | POA: Insufficient documentation

## 2018-05-18 DIAGNOSIS — Y793 Surgical instruments, materials and orthopedic devices (including sutures) associated with adverse incidents: Secondary | ICD-10-CM | POA: Insufficient documentation

## 2018-05-18 DIAGNOSIS — E0590 Thyrotoxicosis, unspecified without thyrotoxic crisis or storm: Secondary | ICD-10-CM | POA: Diagnosis not present

## 2018-05-18 DIAGNOSIS — Z79899 Other long term (current) drug therapy: Secondary | ICD-10-CM | POA: Diagnosis not present

## 2018-05-18 HISTORY — PX: KNEE ARTHROTOMY: SHX5881

## 2018-05-18 LAB — CREATININE, SERUM
Creatinine, Ser: 1 mg/dL (ref 0.44–1.00)
GFR calc Af Amer: >60 mL/min (ref >60)
GFR calc non Af Amer: >60 mL/min (ref >60)

## 2018-05-18 LAB — CBC
HCT: 36 % (ref 35.0–47.0)
Hemoglobin: 12.2 g/dL (ref 12.0–16.0)
MCH: 31.1 pg (ref 26.0–34.0)
MCHC: 34 g/dL (ref 32.0–36.0)
MCV: 91.5 fL (ref 80.0–100.0)
PLATELETS: 259 10*3/uL (ref 150–440)
RBC: 3.93 MIL/uL (ref 3.80–5.20)
RDW: 14.3 % (ref 11.5–14.5)
WBC: 8.5 10*3/uL (ref 3.6–11.0)

## 2018-05-18 SURGERY — ARTHROTOMY, KNEE
Anesthesia: Spinal | Laterality: Left

## 2018-05-18 MED ORDER — CITALOPRAM HYDROBROMIDE 20 MG PO TABS
20.0000 mg | ORAL_TABLET | Freq: Every day | ORAL | Status: DC
  Administered 2018-05-18: 20 mg via ORAL
  Filled 2018-05-18: qty 1

## 2018-05-18 MED ORDER — MIDAZOLAM HCL 2 MG/2ML IJ SOLN
INTRAMUSCULAR | Status: AC
  Filled 2018-05-18: qty 2

## 2018-05-18 MED ORDER — PROPOFOL 10 MG/ML IV BOLUS
INTRAVENOUS | Status: DC | PRN
  Administered 2018-05-18: 50 mg via INTRAVENOUS

## 2018-05-18 MED ORDER — PROMETHAZINE HCL 25 MG/ML IJ SOLN: 6.2500 mg | INTRAMUSCULAR | Status: DC | PRN

## 2018-05-18 MED ORDER — ONDANSETRON HCL 4 MG/2ML IJ SOLN: 4.0000 mg | Freq: Four times a day (QID) | INTRAMUSCULAR | Status: DC | PRN

## 2018-05-18 MED ORDER — MORPHINE SULFATE (PF) 2 MG/ML IV SOLN
2.0000 mg | INTRAVENOUS | Status: DC | PRN
  Administered 2018-05-18 – 2018-05-19 (×8): 2 mg via INTRAVENOUS
  Filled 2018-05-18 (×8): qty 1

## 2018-05-18 MED ORDER — DIPHENHYDRAMINE HCL 12.5 MG/5ML PO ELIX: 12.5000 mg | ORAL_SOLUTION | ORAL | Status: DC | PRN

## 2018-05-18 MED ORDER — ZOLPIDEM TARTRATE 5 MG PO TABS: 5.0000 mg | ORAL_TABLET | Freq: Every evening | ORAL | Status: DC | PRN

## 2018-05-18 MED ORDER — GABAPENTIN 300 MG PO CAPS
300.0000 mg | ORAL_CAPSULE | Freq: Four times a day (QID) | ORAL | Status: DC
  Administered 2018-05-18 – 2018-05-19 (×6): 300 mg via ORAL
  Filled 2018-05-18 (×5): qty 1

## 2018-05-18 MED ORDER — ONDANSETRON HCL 4 MG PO TABS: 4.0000 mg | ORAL_TABLET | Freq: Four times a day (QID) | ORAL | Status: DC | PRN

## 2018-05-18 MED ORDER — PHENOL 1.4 % MT LIQD
1.0000 | OROMUCOSAL | Status: DC | PRN
  Filled 2018-05-18: qty 177

## 2018-05-18 MED ORDER — DOCUSATE SODIUM 100 MG PO CAPS
100.0000 mg | ORAL_CAPSULE | Freq: Two times a day (BID) | ORAL | Status: DC
  Administered 2018-05-18 – 2018-05-19 (×3): 100 mg via ORAL
  Filled 2018-05-18 (×3): qty 1

## 2018-05-18 MED ORDER — NEOMYCIN-POLYMYXIN B GU 40-200000 IR SOLN
Status: DC | PRN
  Administered 2018-05-18: 14 mL

## 2018-05-18 MED ORDER — PROPOFOL 10 MG/ML IV BOLUS
INTRAVENOUS | Status: AC
  Filled 2018-05-18: qty 20

## 2018-05-18 MED ORDER — METOCLOPRAMIDE HCL 5 MG/ML IJ SOLN: 5.0000 mg | Freq: Three times a day (TID) | INTRAMUSCULAR | Status: DC | PRN

## 2018-05-18 MED ORDER — PHENYLEPHRINE HCL 10 MG/ML IJ SOLN
INTRAMUSCULAR | Status: DC | PRN
  Administered 2018-05-18: 30 ug/min via INTRAVENOUS

## 2018-05-18 MED ORDER — FAMOTIDINE 20 MG PO TABS
20.0000 mg | ORAL_TABLET | Freq: Once | ORAL | Status: AC
  Administered 2018-05-18: 20 mg via ORAL

## 2018-05-18 MED ORDER — MAGNESIUM HYDROXIDE 400 MG/5ML PO SUSP: 30.0000 mL | Freq: Every day | ORAL | Status: DC | PRN

## 2018-05-18 MED ORDER — PROPOFOL 500 MG/50ML IV EMUL
INTRAVENOUS | Status: AC
  Filled 2018-05-18: qty 50

## 2018-05-18 MED ORDER — HYDROMORPHONE HCL 1 MG/ML IJ SOLN
1.0000 mg | Freq: Once | INTRAMUSCULAR | Status: AC
  Administered 2018-05-18: 1 mg via INTRAVENOUS
  Filled 2018-05-18: qty 1

## 2018-05-18 MED ORDER — MAGNESIUM CITRATE PO SOLN
1.0000 | Freq: Once | ORAL | Status: DC | PRN
  Filled 2018-05-18: qty 296

## 2018-05-18 MED ORDER — ENOXAPARIN SODIUM 40 MG/0.4ML ~~LOC~~ SOLN
40.0000 mg | SUBCUTANEOUS | Status: DC
  Administered 2018-05-19: 40 mg via SUBCUTANEOUS
  Filled 2018-05-18: qty 0.4

## 2018-05-18 MED ORDER — SENNOSIDES-DOCUSATE SODIUM 8.6-50 MG PO TABS: 1.0000 | ORAL_TABLET | Freq: Every evening | ORAL | Status: DC | PRN

## 2018-05-18 MED ORDER — SODIUM CHLORIDE 0.9 % IV SOLN
INTRAVENOUS | Status: DC
  Administered 2018-05-18 – 2018-05-19 (×2): via INTRAVENOUS

## 2018-05-18 MED ORDER — FENTANYL CITRATE (PF) 100 MCG/2ML IJ SOLN: 25.0000 ug | INTRAMUSCULAR | Status: DC | PRN

## 2018-05-18 MED ORDER — CLINDAMYCIN PHOSPHATE 900 MG/50ML IV SOLN
900.0000 mg | Freq: Once | INTRAVENOUS | Status: AC
  Administered 2018-05-18: 900 mg via INTRAVENOUS

## 2018-05-18 MED ORDER — ONDANSETRON HCL 4 MG/2ML IJ SOLN
INTRAMUSCULAR | Status: DC | PRN
  Administered 2018-05-18: 4 mg via INTRAVENOUS

## 2018-05-18 MED ORDER — ALUM & MAG HYDROXIDE-SIMETH 200-200-20 MG/5ML PO SUSP: 30.0000 mL | ORAL | Status: DC | PRN

## 2018-05-18 MED ORDER — LACTATED RINGERS IV SOLN
INTRAVENOUS | Status: DC
  Administered 2018-05-18 (×2): via INTRAVENOUS

## 2018-05-18 MED ORDER — FLUTICASONE PROPIONATE 50 MCG/ACT NA SUSP
2.0000 | Freq: Every day | NASAL | Status: DC | PRN
  Filled 2018-05-18: qty 16

## 2018-05-18 MED ORDER — CLINDAMYCIN PHOSPHATE 900 MG/50ML IV SOLN
INTRAVENOUS | Status: AC
  Filled 2018-05-18: qty 50

## 2018-05-18 MED ORDER — MENTHOL 3 MG MT LOZG
1.0000 | LOZENGE | OROMUCOSAL | Status: DC | PRN
  Filled 2018-05-18: qty 9

## 2018-05-18 MED ORDER — FAMOTIDINE 20 MG PO TABS
ORAL_TABLET | ORAL | Status: AC
  Administered 2018-05-18: 20 mg via ORAL
  Filled 2018-05-18: qty 1

## 2018-05-18 MED ORDER — PROPOFOL 500 MG/50ML IV EMUL
INTRAVENOUS | Status: DC | PRN
  Administered 2018-05-18: 100 ug/kg/min via INTRAVENOUS

## 2018-05-18 MED ORDER — METHOCARBAMOL 1000 MG/10ML IJ SOLN
500.0000 mg | Freq: Four times a day (QID) | INTRAMUSCULAR | Status: DC | PRN
  Filled 2018-05-18: qty 5

## 2018-05-18 MED ORDER — BUPIVACAINE HCL (PF) 0.5 % IJ SOLN
INTRAMUSCULAR | Status: DC | PRN
  Administered 2018-05-18: 3.1 mL

## 2018-05-18 MED ORDER — MIDAZOLAM HCL 5 MG/5ML IJ SOLN
INTRAMUSCULAR | Status: DC | PRN
  Administered 2018-05-18: 2 mg via INTRAVENOUS

## 2018-05-18 MED ORDER — OXYCODONE HCL 5 MG PO TABS
10.0000 mg | ORAL_TABLET | ORAL | Status: DC | PRN
  Administered 2018-05-18 – 2018-05-19 (×4): 10 mg via ORAL
  Filled 2018-05-18 (×4): qty 2

## 2018-05-18 MED ORDER — METHOCARBAMOL 500 MG PO TABS
500.0000 mg | ORAL_TABLET | Freq: Four times a day (QID) | ORAL | Status: DC | PRN
  Administered 2018-05-19: 500 mg via ORAL
  Filled 2018-05-18: qty 1

## 2018-05-18 MED ORDER — METOCLOPRAMIDE HCL 10 MG PO TABS: 5.0000 mg | ORAL_TABLET | Freq: Three times a day (TID) | ORAL | Status: DC | PRN

## 2018-05-18 MED ORDER — METHOCARBAMOL 500 MG PO TABS
500.0000 mg | ORAL_TABLET | Freq: Four times a day (QID) | ORAL | Status: DC | PRN
  Administered 2018-05-18 – 2018-05-19 (×2): 500 mg via ORAL
  Filled 2018-05-18 (×2): qty 1

## 2018-05-18 MED ORDER — SODIUM CHLORIDE 0.9 % IV SOLN
INTRAVENOUS | Status: DC | PRN
  Administered 2018-05-18: 60 mL

## 2018-05-18 MED ORDER — CLINDAMYCIN PHOSPHATE 600 MG/50ML IV SOLN
600.0000 mg | Freq: Four times a day (QID) | INTRAVENOUS | Status: AC
  Administered 2018-05-18 – 2018-05-19 (×3): 600 mg via INTRAVENOUS
  Filled 2018-05-18 (×3): qty 50

## 2018-05-18 MED ORDER — LEVOTHYROXINE SODIUM 25 MCG PO TABS
125.0000 ug | ORAL_TABLET | Freq: Every day | ORAL | Status: DC
  Administered 2018-05-19: 125 ug via ORAL
  Filled 2018-05-18: qty 1

## 2018-05-18 MED ORDER — BISACODYL 5 MG PO TBEC: 5.0000 mg | DELAYED_RELEASE_TABLET | Freq: Every day | ORAL | Status: DC | PRN

## 2018-05-18 SURGICAL SUPPLY — 41 items
BANDAGE ELASTIC 6 LF NS (GAUZE/BANDAGES/DRESSINGS) ×3 IMPLANT
BLADE BOVIE TIP EXT 4 (BLADE) ×3 IMPLANT
CANISTER SUCT 1200ML W/VALVE (MISCELLANEOUS) ×3 IMPLANT
CHLORAPREP W/TINT 26ML (MISCELLANEOUS) ×3 IMPLANT
CUFF TOURN 24 STER (MISCELLANEOUS) IMPLANT
CUFF TOURN 30 STER DUAL PORT (MISCELLANEOUS) ×3 IMPLANT
ELECT CAUTERY BLADE 6.4 (BLADE) ×3 IMPLANT
ELECT REM PT RETURN 9FT ADLT (ELECTROSURGICAL) ×3
ELECTRODE REM PT RTRN 9FT ADLT (ELECTROSURGICAL) ×1 IMPLANT
GAUZE PETRO XEROFOAM 1X8 (MISCELLANEOUS) ×3 IMPLANT
GAUZE SPONGE 4X4 12PLY STRL (GAUZE/BANDAGES/DRESSINGS) ×3 IMPLANT
GLOVE SURG SYN 9.0  PF PI (GLOVE) ×2
GLOVE SURG SYN 9.0 PF PI (GLOVE) ×1 IMPLANT
GOWN SRG 2XL LVL 4 RGLN SLV (GOWNS) ×1 IMPLANT
GOWN STRL NON-REIN 2XL LVL4 (GOWNS) ×2
GOWN STRL REUS W/ TWL LRG LVL3 (GOWN DISPOSABLE) ×1 IMPLANT
GOWN STRL REUS W/TWL LRG LVL3 (GOWN DISPOSABLE) ×2
IMMOB KNEE 24 THIGH 24 443303 (SOFTGOODS) IMPLANT
KIT PREVENA INCISION MGT20CM45 (CANNISTER) ×6 IMPLANT
KIT TURNOVER KIT A (KITS) ×3 IMPLANT
NEEDLE SPNL 20GX3.5 QUINCKE YW (NEEDLE) ×3 IMPLANT
NS IRRIG 500ML POUR BTL (IV SOLUTION) ×3 IMPLANT
PACK TOTAL KNEE (MISCELLANEOUS) ×3 IMPLANT
PAD ABD DERMACEA PRESS 5X9 (GAUZE/BANDAGES/DRESSINGS) ×3 IMPLANT
PULSAVAC PLUS IRRIG FAN TIP (DISPOSABLE) ×3
STAPLER SKIN PROX 35W (STAPLE) ×3 IMPLANT
SUCTION FRAZIER HANDLE 10FR (MISCELLANEOUS) ×2
SUCTION TUBE FRAZIER 10FR DISP (MISCELLANEOUS) ×1 IMPLANT
SUT DVC 2 QUILL PDO  T11 36X36 (SUTURE) ×2
SUT DVC 2 QUILL PDO T11 36X36 (SUTURE) ×1 IMPLANT
SUT DVC VLOC 3-0 CL 6 P-12 (SUTURE) ×3 IMPLANT
SUT ETHIBOND 2 V 37 (SUTURE) IMPLANT
SUT VIC AB 0 CT1 27 (SUTURE)
SUT VIC AB 0 CT1 27XCR 8 STRN (SUTURE) IMPLANT
SUT VIC AB 0 CT1 36 (SUTURE) IMPLANT
SUT VIC AB 2-0 CT1 27 (SUTURE)
SUT VIC AB 2-0 CT1 TAPERPNT 27 (SUTURE) IMPLANT
SYR 10ML LL (SYRINGE) IMPLANT
SYR 50ML LL SCALE MARK (SYRINGE) ×3 IMPLANT
TIBIAL INSERT SZ4 LT 02120410F (Insert) ×3 IMPLANT
TIP FAN IRRIG PULSAVAC PLUS (DISPOSABLE) ×1 IMPLANT

## 2018-05-18 NOTE — Op Note (Signed)
05/18/2018  11:37 AM  PATIENT:  Alyssa FosterLinda Hughes  57 y.o. female  PRE-OPERATIVE DIAGNOSIS:  ARTHROFIBROSIS OF TOTAL KNEE REPLACEMENT  POST-OPERATIVE DIAGNOSIS: Same  PROCEDURE:  Procedure(s): KNEE ARTHROTOMY AND SYNOVECTOMY (Left)  SURGEON: Leitha SchullerMichael J Nahome Bublitz, MD  ASSISTANTS: Cranston Neighborhris Gaines PA-C  ANESTHESIA:   spinal  EBL:  Total I/O In: 1000 [I.V.:1000] Out: -   BLOOD ADMINISTERED:none  DRAINS: none   LOCAL MEDICATIONS USED:  OTHER Exparel  SPECIMEN:  No Specimen  DISPOSITION OF SPECIMEN:  N/A  COUNTS:  YES  TOURNIQUET:  * Missing tourniquet times found for documented tourniquets in log: [id number] *  IMPLANTS: 10 mm GMK sphere primary 4 left polyethylene insert placed after polythene was removed for posterior capsulotomy  DICTATION: .Dragon Dictation patient was brought to the operating room and after adequate spinal anesthesia was obtained the left leg was prepped and draped you sterile fashion.  After patient identification and timeout procedure the prior incision was utilized with tourniquet raised followed by medial parapatellar arthrotomy at the start of the case the range of motion was approximately 35 to 60 degrees there is extensive arthrofibrosis in the suprapatellar pouch and both gutters which were socked down.  This was the end it after initially opening the knee the gutters and suprapatellar pouch were debrided of synovium with electrocautery to minimize bleeding after getting this mobilized the patella could be retracted laterally and scar behind the patellar tendon was excised as well the medial capsule was elevated and adequate exposure was obtained to remove more scar tissue from the lateral gutter at this point flexion to be brought up to approximately 80degrees with this released no further extension was obtained however so was determined that the pile that they need to be removed after removing the polyethylene without difficulty retractors placed to set to a lamina  spreader to expose the posterior capsule a curved osteotome was used to strip the posterior capsule of the posterior femoral condyle and the scar tissue in the notch to the posterior aspect of the tibial component was excised and partially released to allow for better extension after working both in the gutters suprapatellar pouch and this posterior area reinserting the 10 mm initial insert as a trial range of motion of 5 to 110 degrees was obtained with good stability this is felt to be acceptable at the close the case and so the polyethylene trial was removed and Exparel injected injected around the joint.  The knee was irrigated with pulsatile lavage essentially a lateral release was performed to release scar tissue in the lateral gutter to help maintain patellofemoral alignment through range of motion the arthrotomy was repaired after placing the new polyethylene insert was set screw tightened with a torque screwdriver.  The arthritis repaired with #2 Ethibond followed by heavy Quill, 3-0 v-loc and skin staples followed by an incisional wound VAC  PLAN OF CARE: Admit for overnight observation  PATIENT DISPOSITION:  PACU - hemodynamically stable.

## 2018-05-18 NOTE — Anesthesia Preprocedure Evaluation (Addendum)
Anesthesia Evaluation  Patient identified by MRN, date of birth, ID band Patient awake    Reviewed: Allergy & Precautions, NPO status , Patient's Chart, lab work & pertinent test results  History of Anesthesia Complications Negative for: history of anesthetic complications  Airway Mallampati: III  TM Distance: >3 FB Neck ROM: Full    Dental  (+) Missing   Pulmonary neg pulmonary ROS, neg sleep apnea, neg COPD,    breath sounds clear to auscultation- rhonchi (-) wheezing      Cardiovascular Exercise Tolerance: Good (-) hypertension(-) CAD, (-) Past MI, (-) Cardiac Stents and (-) CABG  Rhythm:Regular Rate:Normal - Systolic murmurs and - Diastolic murmurs    Neuro/Psych Anxiety negative neurological ROS     GI/Hepatic negative GI ROS, Neg liver ROS,   Endo/Other  neg diabetesHypothyroidism   Renal/GU negative Renal ROS     Musculoskeletal  (+) Arthritis ,   Abdominal (+) + obese,   Peds  Hematology  (+) anemia ,   Anesthesia Other Findings Past Medical History: No date: Anemia No date: Anxiety No date: Arthritis No date: Dysrhythmia No date: Hypothyroidism No date: Neuromuscular disorder (HCC)   Reproductive/Obstetrics                             Lab Results  Component Value Date   WBC 11.6 (H) 02/11/2018   HGB 11.2 (L) 02/11/2018   HCT 34.4 (L) 02/11/2018   MCV 94.0 02/11/2018   PLT 262 02/11/2018    Anesthesia Physical  Anesthesia Plan  ASA: II  Anesthesia Plan: Spinal   Post-op Pain Management:    Induction:   PONV Risk Score and Plan: Propofol infusion  Airway Management Planned: Simple Face Mask  Additional Equipment:   Intra-op Plan:   Post-operative Plan:   Informed Consent: I have reviewed the patients History and Physical, chart, labs and discussed the procedure including the risks, benefits and alternatives for the proposed anesthesia with the patient  or authorized representative who has indicated his/her understanding and acceptance.   Dental advisory given  Plan Discussed with: CRNA and Anesthesiologist  Anesthesia Plan Comments:       Anesthesia Quick Evaluation

## 2018-05-18 NOTE — Transfer of Care (Signed)
Immediate Anesthesia Transfer of Care Note  Patient: Alyssa FosterLinda Hughes  Procedure(s) Performed: KNEE ARTHROTOMY AND SYNOVECTOMY (Left )  Patient Location: PACU  Anesthesia Type:Spinal  Level of Consciousness: sedated  Airway & Oxygen Therapy: Patient connected to face mask oxygen  Post-op Assessment: Post -op Vital signs reviewed and stable  Post vital signs: stable  Last Vitals:  Vitals Value Taken Time  BP 92/65 05/18/2018 11:37 AM  Temp 37 C 05/18/2018 11:37 AM  Pulse 80 05/18/2018 11:37 AM  Resp 16 05/18/2018 11:37 AM  SpO2 100 % 05/18/2018 11:37 AM  Vitals shown include unvalidated device data.  Last Pain:  Vitals:   05/18/18 1137  TempSrc: Temporal  PainSc:          Complications: No apparent anesthesia complications

## 2018-05-18 NOTE — Anesthesia Procedure Notes (Signed)
Date/Time: 05/18/2018 10:05 AM Performed by: Junious SilkNoles, Xzaiver Vayda, CRNA Pre-anesthesia Checklist: Patient identified, Emergency Drugs available, Suction available, Patient being monitored and Timeout performed Oxygen Delivery Method: Simple face mask

## 2018-05-18 NOTE — H&P (Signed)
Reviewed paper H+P, will be scanned into chart. No changes noted.  

## 2018-05-18 NOTE — Anesthesia Post-op Follow-up Note (Signed)
Anesthesia QCDR form completed.        

## 2018-05-18 NOTE — Anesthesia Procedure Notes (Signed)
Spinal  Patient location during procedure: OR Start time: 05/18/2018 9:56 AM End time: 05/18/2018 9:52 AM Staffing Resident/CRNA: Nelda Marseille, CRNA Performed: resident/CRNA  Preanesthetic Checklist Completed: patient identified, site marked, surgical consent, pre-op evaluation, timeout performed, IV checked, risks and benefits discussed and monitors and equipment checked Spinal Block Patient position: sitting Prep: Betadine Patient monitoring: heart rate, continuous pulse ox, blood pressure and cardiac monitor Approach: midline Location: L4-5 Injection technique: single-shot Needle Needle type: Whitacre and Introducer  Needle gauge: 25 G Needle length: 9 cm Assessment Sensory level: T10 Additional Notes Negative paresthesia. Negative blood return. Positive free-flowing CSF. Expiration date of kit checked and confirmed. Patient tolerated procedure well, without complications.

## 2018-05-19 ENCOUNTER — Encounter: Payer: Self-pay | Admitting: Orthopedic Surgery

## 2018-05-19 DIAGNOSIS — T8482XS Fibrosis due to internal orthopedic prosthetic devices, implants and grafts, sequela: Secondary | ICD-10-CM | POA: Diagnosis not present

## 2018-05-19 MED ORDER — OXYCODONE HCL 5 MG PO TABS: 5.0000 mg | ORAL_TABLET | ORAL | 0 refills | Status: AC | PRN

## 2018-05-19 MED ORDER — ENOXAPARIN SODIUM 40 MG/0.4ML ~~LOC~~ SOLN: 40.0000 mg | SUBCUTANEOUS | 0 refills | Status: AC

## 2018-05-19 MED ORDER — OXYCODONE HCL 5 MG PO TABS: 5.0000 mg | ORAL_TABLET | ORAL | 0 refills | Status: DC | PRN

## 2018-05-19 MED ORDER — ENOXAPARIN SODIUM 40 MG/0.4ML ~~LOC~~ SOLN: 40.0000 mg | SUBCUTANEOUS | 0 refills | Status: DC

## 2018-05-19 NOTE — Discharge Instructions (Signed)
Diet: As you were doing prior to hospitalization   Shower:  May shower but keep the wounds dry, use an occlusive plastic wrap, NO SOAKING IN TUB.  If the bandage gets wet, change with a clean dry gauze.  Dressing:  Please remove provena negative pressure dressing on 05/25/18 and apply honey comb dressing. Keep dressing clean and dry at all times.   Activity:  Increase activity slowly as tolerated, but follow the weight bearing instructions below.  No lifting or driving for 6 weeks.  Weight Bearing:   Weight bearing as tolerated to left lower extremity  To prevent constipation: you may use a stool softener such as -  Colace (over the counter) 100 mg by mouth twice a day  Drink plenty of fluids (prune juice may be helpful) and high fiber foods Miralax (over the counter) for constipation as needed.    Itching:  If you experience itching with your medications, try taking only a single pain pill, or even half a pain pill at a time.  You may take up to 10 pain pills per day, and you can also use benadryl over the counter for itching or also to help with sleep.   Precautions:  If you experience chest pain or shortness of breath - call 911 immediately for transfer to the hospital emergency department!!  If you develop a fever greater that 101 F, purulent drainage from wound, increased redness or drainage from wound, or calf pain-Call [facility]                                               Follow- Up Appointment:  Please [facility] for [facility]

## 2018-05-19 NOTE — Discharge Summary (Signed)
Physician Discharge Summary  Patient ID: Alyssa Hughes MRN: [id number] DOB/AGE: 57/07/1961 57 y.o.  Admit date: 05/18/2018 Discharge date: 05/19/2018  Admission Diagnoses:  Arthrofibrosis of total knee arthroplasty, sequela [T84.82XS]   Discharge Diagnoses: Patient Active Problem List   Diagnosis Date Noted  . Arthrofibrosis of total knee arthroplasty, sequela 05/18/2018  . Status post total knee replacement using cement, left 02/09/2018    Past Medical History:  Diagnosis Date  . Anemia   . Anxiety   . Arthritis   . Complication of anesthesia   . Dysrhythmia    WITHN THYROID ISSUES  . Hypothyroidism   . PONV (postoperative nausea and vomiting)    NAUSEA ONLY     Transfusion: none   Consultants (if any):   Discharged Condition: Improved  Hospital Course: Alyssa FosterLinda Hughes is an 57 y.o. female who was admitted 05/18/2018 with a diagnosis of left total knee arthrofibrosis and went to the operating room on 05/18/2018 and underwent the above named procedures.    Surgeries: Procedure(s): KNEE ARTHROTOMY AND SYNOVECTOMY on 05/18/2018 Patient tolerated the surgery well. Taken to PACU where she was stabilized and then transferred to the orthopedic floor.  Started on Lovenox 40mg  q 24 hrs. Foot pumps applied bilaterally at 80 mm. Heels elevated on bed with rolled towels. No evidence of DVT. Negative Homan. Physical therapy started on day #1 for gait training and transfer. OT started day #1 for ADL and assisted devices.  Patient's foley was d/c on day #1. Patient's IV  was d/c on day #1.  On post op day #1 patient was stable and ready for discharge to home with outpatient PT.  Please remove provena negative pressure dressing on 05/25/18 and apply honey comb dressing. Keep dressing clean and dry at all times.    Implants: none  She was given perioperative antibiotics:  Anti-infectives (From admission, onward)   Start     Dose/Rate Route Frequency Ordered Stop   05/18/18 1800   clindamycin (CLEOCIN) IVPB 600 mg     600 mg 100 mL/hr over 30 Minutes Intravenous Every 6 hours 05/18/18 1528 05/19/18 0544   05/18/18 0817  clindamycin (CLEOCIN) 900 MG/50ML IVPB    Note to Pharmacy:  Letta PateKinney, Nicole   : cabinet override      05/18/18 0817 05/18/18 0956   05/18/18 0115  clindamycin (CLEOCIN) IVPB 900 mg     900 mg 100 mL/hr over 30 Minutes Intravenous  Once 05/18/18 0105 05/18/18 1006    .  She was given sequential compression devices, early ambulation, and Lovenox for DVT prophylaxis.  She benefited maximally from the hospital stay and there were no complications.    Recent vital signs:  Vitals:   05/19/18 0321 05/19/18 0842  BP: 128/77 109/76  Pulse: (!) 105 99  Resp:    Temp: 98.4 F (36.9 C) 99.1 F (37.3 C)  SpO2: 98% 100%    Recent laboratory studies:  Lab Results  Component Value Date   HGB 12.2 05/18/2018   HGB 11.2 (L) 02/11/2018   HGB 11.8 (L) 02/09/2018   Lab Results  Component Value Date   WBC 8.5 05/18/2018   PLT 259 05/18/2018   Lab Results  Component Value Date   INR 1.00 01/27/2018   Lab Results  Component Value Date   NA 136 02/11/2018   K 4.1 02/11/2018   CL 102 02/11/2018   CO2 26 02/11/2018   BUN 12 02/11/2018   CREATININE 1.00 05/18/2018   GLUCOSE 121 (H) 02/11/2018  Discharge Medications:   Allergies as of 05/19/2018      Reactions   Penicillins Hives, Rash, Other (See Comments)   Has patient had a PCN reaction causing immediate rash, facial/tongue/throat swelling, SOB or lightheadedness with hypotension: Yes Has patient had a PCN reaction causing severe rash involving mucus membranes or skin necrosis: Yes Has patient had a PCN reaction that required hospitalization: Unknown Has patient had a PCN reaction occurring within the last 10 years: No If all of the above answers are "NO", then may proceed with Cephalosporin use.      Medication List    STOP taking these medications   oxycodone 5 MG capsule Commonly  known as:  OXY-IR Replaced by:  oxyCODONE 5 MG immediate release tablet     TAKE these medications   citalopram 20 MG tablet Commonly known as:  CELEXA Take 20 mg by mouth at bedtime.   enoxaparin 40 MG/0.4ML injection Commonly known as:  LOVENOX Inject 0.4 mLs (40 mg total) into the skin daily for 14 days.   fluticasone 50 MCG/ACT nasal spray Commonly known as:  FLONASE Place 2 sprays into both nostrils daily as needed for allergies.   gabapentin 300 MG capsule Commonly known as:  NEURONTIN Take 300 mg by mouth 4 (four) times daily.   levothyroxine 125 MCG tablet Commonly known as:  SYNTHROID, LEVOTHROID Take 125 mcg by mouth daily before breakfast.   methocarbamol 500 MG tablet Commonly known as:  ROBAXIN Take 1 tablet (500 mg total) by mouth every 6 (six) hours as needed for muscle spasms.   ONE-A-DAY 50 PLUS PO Take 1 tablet by mouth daily.   oxyCODONE 5 MG immediate release tablet Commonly known as:  Oxy IR/ROXICODONE Take 1-2 tablets (5-10 mg total) by mouth every 3 (three) hours as needed for moderate pain. Replaces:  oxycodone 5 MG capsule   senna-docusate 8.6-50 MG tablet Commonly known as:  Senokot-S Take 1 tablet by mouth at bedtime as needed for mild constipation.            Durable Medical Equipment  (From admission, onward)        Start     Ordered   05/18/18 1529  For home use only DME Continuous passive motion machine  Once     05/18/18 1528   05/18/18 1529  For Home Use Only DME CPM  Once    Question Answer Comment  Laterality Left Knee   Starting Flexion 90   Ending Flexion 0   Increase by Daily 5      05/18/18 1528      Diagnostic Studies: No results found.  Disposition: Discharge disposition: 01-Home or Self Care         Follow-up Information    Evon Slack, PA-C Follow up in 2 week(s).   Specialties:  Orthopedic Surgery, Emergency Medicine Why:  for staple removal Contact information: [address]  [place] [address] [phone]            Signed: Patience Musca 05/19/2018, 9:06 AM

## 2018-05-19 NOTE — Progress Notes (Signed)
Physical Therapy Treatment Patient Details Name: Alyssa Hughes MRN: [id number] DOB: Aug 15, 1961 Today's Date: 05/19/2018    History of Present Illness Pt is a 57 y.o. female s/p L TKA 02/09/2018 who was admitted 05/18/2018 with a diagnosis of left total knee arthrofibrosis and went to the operating room on 05/18/2018 and underwent L knee arthrotomy and synovectomy.  PMH includes anemia, anxiety, dysrhythmia, hypothyroidism, and obesity.      PT Comments    Initial attempt, pt on the phone with insurance company and requested attempt later. Second attempt, pt agreeable to PT. Pt reports 10/10 pain in L knee with movement and slightly less at rest. Pt frustrated with current situation, as she want to be better/improve, but is dealing with significant, limiting pain and lack of motion. Pt continues to require max A for mobility of LLE out of bed. Increased time spent on education on range of motion/basic strength exercises using half power technique for management of pain response. Pt requires Mod to Max A for bed to chair transfer due to inability to attain full stand and tolerate L weight bearing. Pt would benefit ideally from continued higher level of care for optimal safety (pt high fall risk at this time) and progression of range, strength and activity tolerance to improve functional mobility to a level to manage with intermittent assist.    Follow Up Recommendations  SNF;Supervision for mobility/OOB     Equipment Recommendations       Recommendations for Other Services       Precautions / Restrictions Precautions Precautions: Knee Restrictions Weight Bearing Restrictions: Yes LLE Weight Bearing: Weight bearing as tolerated    Mobility  Bed Mobility Overal bed mobility: Needs Assistance Bed Mobility: Supine to Sit     Supine to sit: Mod assist     General bed mobility comments: Pt manages upper body well, but needs max support of LLE out of bed/to floor  Transfers Overall  transfer level: Needs assistance Equipment used: Rolling walker (2 wheeled) Transfers: Sit to/from [employer] Sit to Stand: Mod assist   Squat pivot transfers: Mod assist     General transfer comment: Attempted STS with rw; turned into squat pivot, as pt does not reach full stand with rw, but squats with most weight on RLE. Premature sitting at very edge of recliner requiring therapist to pull recliner further under pt for safe sitting.   Ambulation/Gait             General Gait Details: Unable at this time   Stairs             Wheelchair Mobility    Modified Rankin (Stroke Patients Only)       Balance Overall balance assessment: Needs assistance Sitting-balance support: Feet supported;No upper extremity supported Sitting balance-Leahy Scale: Good     Standing balance support: Bilateral upper extremity supported Standing balance-Leahy Scale: Poor                              Cognition Arousal/Alertness: Awake/alert Behavior During Therapy: WFL for tasks assessed/performed;Anxious Overall Cognitive Status: Within Functional Limits for tasks assessed                                 General Comments: anxious with movement; frustrated with situation beyond therapy realm      Exercises General Exercises - Lower Extremity Ankle Circles/Pumps: AROM;Both;20 reps;Seated  Quad Sets: Strengthening;Left;20 reps;Supine;Seated(each) Other Exercises Other Exercises: Instructed pt in both aarom using RLE over L ankle for L knee flexion and RLE behind L ankle for assist L knee extension. Does not attain much range in either direction Other Exercises: Educated pt in using half power with L knee range of motion and isometrics to limit heightened pain response    General Comments        Pertinent Vitals/Pain Pain Assessment: 0-10 Pain Score: 10-Worst pain ever(With movement, somewhat less at rest) Pain Location: L knee Pain  Descriptors / Indicators: Aching;Sharp;Constant;Operative site guarding;Tightness;Tiring Pain Intervention(s): Premedicated before session;Limited activity within patient's tolerance;Monitored during session;Repositioned;Ice applied;Other (comment)(encouraged half power with exercise)    Home Living                      Prior Function            PT Goals (current goals can now be found in the care plan section) Progress towards PT goals: Progressing toward goals(slowly due to high pain and limited L knee range)    Frequency    BID      PT Plan Current plan remains appropriate    Co-evaluation              AM-PAC PT "6 Clicks" Daily Activity  Outcome Measure  Difficulty turning over in bed (including adjusting bedclothes, sheets and blankets)?: Unable Difficulty moving from lying on back to sitting on the side of the bed? : Unable Difficulty sitting down on and standing up from a chair with arms (e.g., wheelchair, bedside commode, etc,.)?: Unable Help needed moving to and from a bed to chair (including a wheelchair)?: A Lot Help needed walking in hospital room?: Total Help needed climbing 3-5 steps with a railing? : Total 6 Click Score: 7    End of Session Equipment Utilized During Treatment: Gait belt Activity Tolerance: Patient limited by pain Patient left: in chair;with chair alarm set   PT Visit Diagnosis: Other abnormalities of gait and mobility (R26.89);Muscle weakness (generalized) (M62.81)     Time: 1435-1525(SW/CM in during session therefore only 2 units billed time) PT Time Calculation (min) (ACUTE ONLY): 50 min  Charges:  $Therapeutic Exercise: 8-22 mins $Therapeutic Activity: 8-22 mins                    G [place]:        Keyontay Stolz E Thomos Domine, PTA 05/19/2018, 4:40 PM

## 2018-05-19 NOTE — Progress Notes (Signed)
   Subjective: 1 Day Post-Op Procedure(s) (LRB): KNEE ARTHROTOMY AND SYNOVECTOMY (Left) Patient reports pain as severe.  Patient is due pain medication. Patient is well, and has had no acute complaints or problems.  Tolerated CPM well yesterday. Denies any CP, SOB, ABD pain. We will continue therapy today.  Plan is to go Home after hospital stay.  Objective: Vital signs in last 24 hours: Temp:  [97.2 F (36.2 C)-99.1 F (37.3 C)] 98.4 F (36.9 C) (06/26 0321) Pulse Rate:  [64-105] 105 (06/26 0321) Resp:  [11-18] 15 (06/25 1322) BP: (92-138)/(53-86) 128/77 (06/26 0321) SpO2:  [70 %-100 %] 98 % (06/26 0321) Weight:  [94.3 kg (207 lb 12.8 oz)] 94.3 kg (207 lb 12.8 oz) (06/25 0813)  Intake/Output from previous day: 06/25 0701 - 06/26 0700 In: 1399.6 [I.V.:1296.3; IV Piggyback:103.3] Out: 1225 [Urine:1225] Intake/Output this shift: No intake/output data recorded.  Recent Labs    05/18/18 1808  HGB 12.2   Recent Labs    05/18/18 1808  WBC 8.5  RBC 3.93  HCT 36.0  PLT 259   Recent Labs    05/18/18 1643  CREATININE 1.00   No results for input(s): LABPT, INR in the last 72 hours.  EXAM General - Patient is Alert, Appropriate and Oriented Extremity - Neurovascular intact Sensation intact distally Intact pulses distally Dorsiflexion/Plantar flexion intact No cellulitis present Compartment soft Dressing - dressing C/D/I and no drainage.  Wound VAC is intact without drainage  Motor Function - intact, moving foot and toes well on exam.   Past Medical History:  Diagnosis Date  . Anemia   . Anxiety   . Arthritis   . Complication of anesthesia   . Dysrhythmia    WITHN THYROID ISSUES  . Hypothyroidism   . PONV (postoperative nausea and vomiting)    NAUSEA ONLY    Assessment/Plan:   1 Day Post-Op Procedure(s) (LRB): KNEE ARTHROTOMY AND SYNOVECTOMY (Left) Active Problems:   Arthrofibrosis of total knee arthroplasty, sequela  Estimated body mass index is  28.98 kg/m as calculated from the following:   Height as of this encounter: 5\' 11"  (1.803 m).   Weight as of this encounter: 94.3 kg (207 lb 12.8 oz). Advance diet Up with therapy  Care management to assist with discharge to home Would like for patient to receive CPM at home  DVT Prophylaxis - Lovenox, Foot Pumps and TED hose Weight-Bearing as tolerated to left leg   T. Cranston Neighborhris Birtie Fellman, PA-C [facility] 05/19/2018, 8:12 AM

## 2018-05-19 NOTE — Clinical Social Work Note (Addendum)
Clinical Social Work Assessment  Patient Details  Name: Alyssa Hughes MRN: [id number] Date of Birth: 03/21/1961  Date of referral:  05/19/18               Reason for consult:  Facility Placement                Permission sought to share information with:  Case Manager Permission granted to share information::  Yes, Verbal Permission Granted  Name::        Agency::     Relationship::     Contact Information:     Housing/Transportation Living arrangements for the past 2 months:  Single Family Home Source of Information:  Patient Patient Interpreter Needed:  None Criminal Activity/Legal Involvement Pertinent to Current Situation/Hospitalization:  No - Comment as needed Significant Relationships:  Adult Children Lives with:  Self Do you feel safe going back to the place where you live?  Yes Need for family participation in patient care:  Yes (Comment)  Care giving concerns: Patient lives alone in [place].    Social Worker assessment / plan:  Holiday representative (CSW) received SNF consult. PT is recommending SNF however patient is in an "outpatient in bed" status. CSW met with patient alone at bedside to discuss D/C plan. Patient was alert and oriented X4 and was sitting up in the bed. CSW introduced self and explained role of CSW department. Patient reported that she lives alone in [place] and would like to go to SNF. CSW explained that medicare requires a 3 night qualifying inpatient stay in a hospital in order to pay for SNF and made her aware that she is not inpatient. CSW asked if her secondary insurance BCBS would cover SNF and CSW explained that [place] would only cover the co-pays at SNF because it is a Consulting civil engineer. Patient verbalized her understanding. CSW explained private pay SNF and patient reported that she could not pay privately for SNF.  CSW met with patient for a second and third time and explained that above information again. Per patient she will D/C home  today and her ride will come after she has dinner. RN and RN case manager aware of above. Please reconsult if future social work needs arise. CSW signing off.   Employment status:  Retired Forensic scientist:  Medicare PT Recommendations:  [place] / Referral to community resources:  Other (Comment Required)(Patient will D/C home with home health. )  Patient/Family's Response to care:  Patient will D/C home.   Patient/Family's Understanding of and Emotional Response to Diagnosis, Current Treatment, and Prognosis:  Patient is agreeable to D/C home today.   Emotional Assessment Appearance:  Appears stated age Attitude/Demeanor/Rapport:    Affect (typically observed):  Accepting, Adaptable, Pleasant Orientation:  Oriented to Self, Oriented to Place, Oriented to  Time, Oriented to Situation Alcohol / Substance use:  Not Applicable Psych involvement (Current and /or in the community):  No (Comment)  Discharge Needs  Concerns to be addressed:  No discharge needs identified Readmission within the last 30 days:  No Current discharge risk:  Dependent with Mobility Barriers to Discharge:  No Barriers Identified   Zhaire Locker, Veronia Beets, LCSW 05/19/2018, 5:40 PM

## 2018-05-19 NOTE — Anesthesia Postprocedure Evaluation (Signed)
Anesthesia Post Note  Patient: Alyssa FosterLinda Hughes  Procedure(s) Performed: KNEE ARTHROTOMY AND SYNOVECTOMY (Left )  Patient location during evaluation: Nursing Unit Anesthesia Type: Spinal Level of consciousness: awake, awake and alert and oriented Pain management: pain level controlled Vital Signs Assessment: post-procedure vital signs reviewed and stable Respiratory status: spontaneous breathing, nonlabored ventilation and respiratory function stable Cardiovascular status: blood pressure returned to baseline and stable Postop Assessment: no headache and no backache Anesthetic complications: no Comments: States woke up in room in severe pain and took the nurse awhile to get her a pain medicine     Last Vitals:  Vitals:   05/19/18 0842 05/19/18 1154  BP: 109/76 120/69  Pulse: 99 100  Resp:    Temp: 37.3 C 37.8 C  SpO2: 100% 97%    Last Pain:  Vitals:   05/19/18 1345  TempSrc:   PainSc: 7                  [employer] CorporationStephanie Rawleigh Rode

## 2018-05-19 NOTE — Discharge Planning (Signed)
Patient is frustrated with DC situation and wants to exhaust her resources/options of possibly going to Rehab.  Patient was told her Primary insurance (Medicare) denied her going to rehab, so therefore her secondary insurance [employer](BCBS) would automatically deny.  However, patient's daughter works at [employer] and told patient that sometimes BCBS will still accept a claim to go to rehab ( If, a request were submitted.)  Patient is asking for the request to be placed for BCBS, even if her primary is declining.    Patient stated, "I am ok with not going to rehab, if both my insurance options are attempted and declined."  Per patient's request, the patient's frustration and requests were proposed to SW - who stated  "there is no request to submit."  Not satisfied with that answer, the patient requested to speak with a supervisor.   AC contacted to attempt to resolve frustration. (Since Scientific laboratory technician1A director and assistant director had left for the day). Both AC and SW spoke with patient to explain the insurance process.

## 2018-05-19 NOTE — Care Management Note (Signed)
Case Management Note  Patient Details  Name: Alyssa Hughes MRN: [id number] Date of Birth: [birth year]  Subjective/Objective: Met with patient at bedside. She is upset because CPM machine was denied by Medicare.She ask that I have Ruby Cola with [employer] run her BCBS supplement. I did and he states that the [facility] will not cover her CPM machine.  He was going to call the patient and explain why it isnt covered although he did submit it to her [place].  Offered patient home health services and she prefers to use [facility] like she did in the past. Referral to [facility] for [place], the [place] office will pick her up.[facility] [phone]. Cost of Lovenox is $ 45.24. Patient updated.                      Action/Plan: [facility] for HHPT, Lovenox called in, No DME,   Expected Discharge Date:  05/19/18               Expected Discharge Plan:  [place]  In-House Referral:     Discharge planning Services  CM Consult  Post Acute Care Choice:  Home Health Choice offered to:  Patient  DME Arranged:    DME Agency:     HH Arranged:  PT [place]:  [facility] ([facility])  Status of Service:  Completed, signed off  If discussed at [employer], dates discussed:    Additional Comments:  Jolly Mango, RN 05/19/2018, 3:24 PM

## 2018-05-19 NOTE — Discharge Planning (Addendum)
Patient IV removed. Discharge papers given, explained and educated.  RN assessment [facility]  Informed of suggest FU appt - since leaving with [place] intact.  Patient agreed to contact to set up -  since leaving after office hours. Scripts printed and given.  Once ready, will be wheeled to front and family transporting home via car. Gave pain medication just prior to DC (to assist with ride home).

## 2018-05-19 NOTE — Evaluation (Signed)
Physical Therapy Evaluation Patient Details Name: Alyssa Hughes MRN: [id number] DOB: Oct 03, 1961 Today's Date: 05/19/2018   History of Present Illness  Pt is a 57 y.o. female s/p L TKA 02/09/2018 who was admitted 05/18/2018 with a diagnosis of left total knee arthrofibrosis and went to the operating room on 05/18/2018 and underwent L knee arthrotomy and synovectomy.  PMH includes anemia, anxiety, dysrhythmia, hypothyroidism, and obesity.      Clinical Impression  Pt presents with deficits in strength, transfers, mobility, gait, balance, L knee ROM, and activity tolerance.  Pt required extensive assist with bed mobility tasks with extra time and effort.  Pt required mod A  And verbal cues for sequencing during sit to/from stand transfer training with pt unable to come to complete upright secondary to L knee pain with WB.  Overall pt was highly limited with functional mobility tasks by L knee pain with pt guarded during any attempts to move the L knee.  Pt reports she does not have any available assistance at home at this time and will benefit from discharge once medically stable to a SNF to safely address the above deficits for eventual safe return to her prior living situation.       Follow Up Recommendations SNF;Supervision for mobility/OOB    Equipment Recommendations  None recommended by PT    Recommendations for Other Services       Precautions / Restrictions Precautions Precautions: Knee Restrictions Weight Bearing Restrictions: Yes LLE Weight Bearing: Weight bearing as tolerated      Mobility  Bed Mobility Overal bed mobility: Needs Assistance Bed Mobility: Supine to Sit;Sit to Supine     Supine to sit: Mod assist Sit to supine: Mod assist   General bed mobility comments: Total assist for LLE in and out of bed with very slow, guarded movements  Transfers Overall transfer level: Needs assistance Equipment used: Rolling walker (2 wheeled) Transfers: Sit to/from Stand Sit to  Stand: Mod assist         General transfer comment: Mod verbal cues for sequencing with pt unable to come to complete upright secondary to L knee pain with WB  Ambulation/Gait             General Gait Details: Unable  Stairs            Wheelchair Mobility    Modified Rankin (Stroke Patients Only)       Balance Overall balance assessment: Needs assistance   Sitting balance-Leahy Scale: Good     Standing balance support: Bilateral upper extremity supported Standing balance-Leahy Scale: Poor                               Pertinent Vitals/Pain Pain Assessment: 0-10 Pain Score: 10-Worst pain ever Pain Location: L knee Pain Descriptors / Indicators: Aching;Shooting;Sharp Pain Intervention(s): Limited activity within patient's tolerance;Monitored during session;Premedicated before session    Home Living Family/patient expects to be discharged to:: Private residence Living Arrangements: Alone Available Help at Discharge: Family;Available PRN/intermittently Type of Home: House Home Access: Stairs to enter Entrance Stairs-Rails: Right;Left(Too wide for both) Entrance Stairs-Number of Steps: 5 Home Layout: One level Home Equipment: Walker - 2 wheels;Crutches      Prior Function Level of Independence: Independent with assistive device(s)         Comments: Since TKA in March pt has been using an AD to amb including ax crutches most recently, one fall in the last year secondary to  tripping without injury     Hand Dominance   Dominant Hand: Right    Extremity/Trunk Assessment   Upper Extremity Assessment Upper Extremity Assessment: Overall WFL for tasks assessed    Lower Extremity Assessment Lower Extremity Assessment: Generalized weakness;LLE deficits/detail LLE Deficits / Details: Pt highly guarded with any L knee movement LLE: Unable to fully assess due to pain LLE Sensation: WNL       Communication   Communication: No  difficulties  Cognition Arousal/Alertness: Awake/alert Behavior During Therapy: WFL for tasks assessed/performed Overall Cognitive Status: Within Functional Limits for tasks assessed                                        General Comments      Exercises Total Joint Exercises Ankle Circles/Pumps: AROM;Both;5 reps;10 reps Quad Sets: Strengthening;AROM;Both;5 reps;10 reps Gluteal Sets: Strengthening;Both;10 reps Hip ABduction/ADduction: AAROM;Left;5 reps Straight Leg Raises: AAROM;Left;5 reps Goniometric ROM: L knee ext lacking 50 deg, unable to assess flex secondary to pain with pt highly guarded with any L knee movement Other Exercises Other Exercises: HEP education for BLE APs, QS, LAQ, and GS Other Exercises: Positioning education/review to encourage L knee ext PROM while out of CPM   Assessment/Plan    PT Assessment Patient needs continued PT services  PT Problem List Decreased strength;Decreased range of motion;Decreased activity tolerance;Decreased balance;Decreased mobility       PT Treatment Interventions DME instruction;Gait training;Stair training;Functional mobility training;Balance training;Therapeutic exercise;Therapeutic activities;Patient/family education    PT Goals (Current goals can be found in the Care Plan section)  Acute Rehab PT Goals Patient Stated Goal: To walk better with less pain PT Goal Formulation: With patient Time For Goal Achievement: 06/01/18 Potential to Achieve Goals: Good    Frequency BID   Barriers to discharge Inaccessible home environment;Decreased caregiver support      Co-evaluation               AM-PAC PT "6 Clicks" Daily Activity  Outcome Measure Difficulty turning over in bed (including adjusting bedclothes, sheets and blankets)?: Unable Difficulty moving from lying on back to sitting on the side of the bed? : Unable Difficulty sitting down on and standing up from a chair with arms (e.g., wheelchair,  bedside commode, etc,.)?: Unable Help needed moving to and from a bed to chair (including a wheelchair)?: Total Help needed walking in hospital room?: Total Help needed climbing 3-5 steps with a railing? : Total 6 Click Score: 6    End of Session Equipment Utilized During Treatment: Gait belt Activity Tolerance: Patient limited by pain Patient left: in bed;with call bell/phone within reach;with bed alarm set;with SCD's reapplied;Other (comment)(CPM re-applied) Nurse Communication: Mobility status PT Visit Diagnosis: Other abnormalities of gait and mobility (R26.89);Muscle weakness (generalized) (M62.81)    Time: [id number] PT Time Calculation (min) (ACUTE ONLY): 48 min   Charges:   PT Evaluation $PT Eval Low Complexity: 1 Low PT Treatments $Therapeutic Exercise: 8-22 mins   PT G Codes:        Elly Modena. Scott Akari [employer] PT, DPT 05/19/18, 11:09 AM

## 2018-08-17 ENCOUNTER — Ambulatory Visit
Payer: Medicare Other | Attending: Student in an Organized Health Care Education/Training Program | Admitting: Student in an Organized Health Care Education/Training Program

## 2018-08-17 ENCOUNTER — Other Ambulatory Visit: Payer: Self-pay

## 2018-08-17 ENCOUNTER — Encounter: Payer: Self-pay | Admitting: Student in an Organized Health Care Education/Training Program

## 2018-08-17 VITALS — BP 136/89 | HR 71 | Temp 98.0°F | Resp 18 | Ht 70.0 in | Wt 194.0 lb

## 2018-08-17 DIAGNOSIS — Z79899 Other long term (current) drug therapy: Secondary | ICD-10-CM | POA: Diagnosis not present

## 2018-08-17 DIAGNOSIS — T8482XS Fibrosis due to internal orthopedic prosthetic devices, implants and grafts, sequela: Secondary | ICD-10-CM

## 2018-08-17 DIAGNOSIS — Z880 Allergy status to penicillin: Secondary | ICD-10-CM | POA: Diagnosis not present

## 2018-08-17 DIAGNOSIS — G894 Chronic pain syndrome: Secondary | ICD-10-CM | POA: Insufficient documentation

## 2018-08-17 DIAGNOSIS — F329 Major depressive disorder, single episode, unspecified: Secondary | ICD-10-CM | POA: Insufficient documentation

## 2018-08-17 DIAGNOSIS — Z96652 Presence of left artificial knee joint: Secondary | ICD-10-CM | POA: Insufficient documentation

## 2018-08-17 DIAGNOSIS — M1712 Unilateral primary osteoarthritis, left knee: Secondary | ICD-10-CM | POA: Diagnosis not present

## 2018-08-17 DIAGNOSIS — F419 Anxiety disorder, unspecified: Secondary | ICD-10-CM | POA: Diagnosis not present

## 2018-08-17 DIAGNOSIS — E039 Hypothyroidism, unspecified: Secondary | ICD-10-CM | POA: Diagnosis not present

## 2018-08-17 NOTE — Patient Instructions (Signed)

## 2018-08-17 NOTE — Progress Notes (Signed)
Patient's Name: Alyssa Hughes  MRN: [id number]  Referring Provider: Hessie Knows, MD  DOB: 03-15-1961  PCP: [facility] [place]  DOS: 08/17/2018  Note by: Gillis Santa, MD  Service setting: Ambulatory outpatient  Specialty: Interventional Pain Management  Location: [facility] (AMB) Pain Management Facility  Visit type: Initial Patient Evaluation  Patient type: New Patient   Primary Reason(s) for Visit: Encounter for initial evaluation of one or more chronic problems (new to examiner) potentially causing chronic pain, and posing a threat to normal musculoskeletal function. (Level of risk: High) CC: Knee Pain (left)  HPI  Alyssa Hughes is a 57 y.o. year old, female patient, who comes today to see [place] for the first time for an initial evaluation of her chronic pain. She has Status post total knee replacement using cement, left and Arthrofibrosis of total knee arthroplasty, sequela on their problem list. Today she comes in for evaluation of her Knee Pain (left)  Pain Assessment: Location: Left(mainly on inside ) Knee Radiating: sometimes radiates depending on activity Onset: More than a month ago Duration: Chronic pain Quality: Aching, Sharp Severity: 5 /10 (subjective, self-reported pain score)  Note: Reported level is compatible with observation.                         When using our objective Pain Scale, levels between 6 and 10/10 are said to belong in an emergency room, as it progressively worsens from a 6/10, described as severely limiting, requiring emergency care not usually available at an outpatient pain management facility. At a 6/10 level, communication becomes difficult and requires great effort. Assistance to reach the emergency department may be required. Facial flushing and profuse sweating along with potentially dangerous increases in heart rate and blood pressure will be evident. Effect on ADL: Limits activities Timing: Constant Modifying factors: medication, rest BP: 136/89  HR:  71  Onset and Duration: Present longer than 3 months Cause of pain: Work related accident or event Severity: Getting better, NAS-11 at its worse: 10/10, NAS-11 at its best: 4/10, NAS-11 now: 6/10 and NAS-11 on the average: 8/10 Timing: Not influenced by the time of the day and During activity or exercise Aggravating Factors: Kneeling, Lifiting, Prolonged standing, Squatting, Walking and Walking uphill Alleviating Factors: Cold packs, Hot packs, Lying down, Medications, Resting, Sitting, Sleeping and Warm showers or baths Associated Problems: Depression, Sadness, Spasms, Swelling, Tingling and Pain that wakes patient up Quality of Pain: Annoying, Cramping, Getting shorter, Sharp, Shooting, Throbbing, Tingling and Uncomfortable Previous Examinations or Tests: MRI scan, X-rays and Nerve conduction test Previous Treatments: Narcotic medications, Physical Therapy, Pool exercises and Stretching exercises  The patient comes into the clinics today for the first time for a chronic pain management evaluation.  Patient is a pleasant 57 year old female with a history of left knee replacement surgery and February 2019 status post manipulation under anesthesia subsequently and April followed by revision surgery in June who presents with left knee pain and left knee instability.  Patient has been participating in land-based and aquatic-based physical therapy.  She is referred here for consideration of left knee genicular nerve block and possible radio frequency ablation.  Medication management to continue per PCP.  Today I took the time to provide the patient with information regarding my pain practice. The patient was informed that my practice is divided into two sections: an interventional pain management section, as well as a completely separate and distinct medication management section. I explained that I have procedure days  for my interventional therapies, and evaluation days for follow-ups and medication  management. Because of the amount of documentation required during both, they are kept separated. This means that there is the possibility that she may be scheduled for a procedure on one day, and medication management the next. I have also informed her that because of staffing and facility limitations, I no longer take patients for medication management only. To illustrate the reasons for this, I gave the patient the example of surgeons, and how inappropriate it would be to refer a patient to his/her care, just to write for the post-surgical antibiotics on a surgery done by a different surgeon.   Because interventional pain management is my board-certified specialty, the patient was informed that joining my practice means that they are open to any and all interventional therapies. I made it clear that this does not mean that they will be forced to have any procedures done. What this means is that I believe interventional therapies to be essential part of the diagnosis and proper management of chronic pain conditions. Therefore, patients not interested in these interventional alternatives will be better served under the care of a different practitioner.  The patient was also made aware of my Comprehensive Pain Management Safety Guidelines where by joining my practice, they limit all of their nerve blocks and joint injections to those done by our practice, for as long as we are retained to manage their care.   Historic Controlled Substance Pharmacotherapy Review  PMP and historical list of controlled substances: Oxycodone 5 mg, quantity 21, fill date 7 days, MME equals 22.5 Pharmacodynamics: Desired effects: Analgesia: The patient reports >50% benefit. Reported improvement in function: The patient reports medication allows her to accomplish basic ADLs. Clinically meaningful improvement in function (CMIF): Sustained CMIF goals met Perceived effectiveness: Described as relatively effective, allowing for  increase in activities of daily living (ADL) Undesirable effects: Side-effects or Adverse reactions: None reported Historical Monitoring: The patient  reports that she has current or past drug history. Drug: Marijuana. List of all UDS Test(s): No results found for: MDMA, COCAINSCRNUR, [place], [place], CANNABQUANT, [place], [place] List of other Serum/Urine Drug Screening Test(s):  No results found for: AMPHSCRSER, BARBSCRSER, BENZOSCRSER, COCAINSCRSER, COCAINSCRNUR, PCPSCRSER, PCPQUANT, THCSCRSER, THCU, CANNABQUANT, OPIATESCRSER, OXYSCRSER, PROPOXSCRSER, ETH Historical Background Evaluation: [place] PMP: Six (6) year initial data search conducted.             Risk Assessment Profile: Personal History of Substance Abuse (SUD-Substance use disorder):  Alcohol: Negative  Illegal Drugs: Negative  Rx Drugs: Negative  ORT Risk Level calculation: Moderate Risk Opioid Risk Tool - 08/17/18 0806      Family History of Substance Abuse   Alcohol  Negative    Illegal Drugs  Negative    Rx Drugs  Positive Female or Female      Personal History of Substance Abuse   Alcohol  Negative    Illegal Drugs  Negative    Rx Drugs  Negative      Age   Age between 73-45 years   No      History of Preadolescent Sexual Abuse   History of Preadolescent Sexual Abuse  Negative or Female      Psychological Disease   Psychological Disease  Negative    Depression  Negative      Total Score   Opioid Risk Tool Scoring  4    Opioid Risk Interpretation  Moderate Risk      ORT Scoring interpretation table:  Score <  3 = Low Risk for SUD  Score between 4-7 = Moderate Risk for SUD  Score >8 = High Risk for Opioid Abuse   PHQ-2 Depression Scale:  Total score: 0  PHQ-2 Scoring interpretation table: (Score and probability of major depressive disorder)  Score 0 = No depression  Score 1 = 15.4% Probability  Score 2 = 21.1% Probability  Score 3 = 38.4% Probability  Score 4 = 45.5% Probability  Score 5 = 56.4%  Probability  Score 6 = 78.6% Probability   PHQ-9 Depression Scale:  Total score: 0  PHQ-9 Scoring interpretation table:  Score 0-4 = No depression  Score 5-9 = Mild depression  Score 10-14 = Moderate depression  Score 15-19 = Moderately severe depression  Score 20-27 = Severe depression (2.4 times higher risk of SUD and 2.89 times higher risk of overuse)   Pharmacologic Plan: Continue medication management with PCP.  This is fast-track evaluation for left knee genicular nerve block and possible cooled radio frequency ablation.              Meds   Current Outpatient Medications:  .  citalopram (CELEXA) 20 MG tablet, Take 20 mg by mouth at bedtime. , Disp: , Rfl:  .  fluticasone (FLONASE) 50 MCG/ACT nasal spray, Place 2 sprays into both nostrils daily as needed for allergies. , Disp: , Rfl: 2 .  gabapentin (NEURONTIN) 300 MG capsule, Take 300 mg by mouth 4 (four) times daily., Disp: , Rfl: 2 .  levothyroxine (SYNTHROID, LEVOTHROID) 125 MCG tablet, Take 125 mcg by mouth daily before breakfast. , Disp: , Rfl: 11 .  methocarbamol (ROBAXIN) 500 MG tablet, Take 1 tablet (500 mg total) by mouth every 6 (six) hours as needed for muscle spasms., Disp: 30 tablet, Rfl: 0 .  Multiple Vitamins-Minerals (ONE-A-DAY 50 PLUS PO), Take 1 tablet by mouth daily., Disp: , Rfl:  .  oxyCODONE (OXY IR/ROXICODONE) 5 MG immediate release tablet, Take 1-2 tablets (5-10 mg total) by mouth every 3 (three) hours as needed for moderate pain., Disp: 40 tablet, Rfl: 0 .  senna-docusate (SENOKOT-S) 8.6-50 MG tablet, Take 1 tablet by mouth at bedtime as needed for mild constipation., Disp: , Rfl:  .  enoxaparin (LOVENOX) 40 MG/0.4ML injection, Inject 0.4 mLs (40 mg total) into the skin daily for 14 days., Disp: 14 Syringe, Rfl: 0  Imaging Review    Knee-L CT wo contrast:  Results for orders placed during the hospital encounter of 12/30/17  CT KNEE LEFT WO CONTRAST   Narrative CLINICAL DATA:  Patient for left knee  replacement. Preoperative My Knee protocol planning study.  EXAM: CT OF THE LEFT KNEE WITHOUT CONTRAST  TECHNIQUE: Multidetector CT imaging of the left knee was performed according to the standard protocol. Multiplanar CT image reconstructions were also generated. Axial imaging only of the left hip and ankle was also performed.  COMPARISON:  None.  FINDINGS: Bones/Joint/Cartilage  Tricompartmental degenerative disease about the left knee is worst medially where there is bone-on-bone joint space narrowing. No acute abnormality is identified. No focal bony lesion. Axial imaging only of the left hip and ankle demonstrates no acute or focal bony abnormality. Degenerative change about the symphysis pubis is noted.  Ligaments  Suboptimally assessed by CT.  Muscles and Tendons  Intact.  Soft tissues  The patient has a Baker's cyst measuring approximately 1.6 cm AP by 1 cm transverse by 4 cm craniocaudal. The cyst contains 3-4 loose bodies measuring up to 0.8 cm.  IMPRESSION: No  acute abnormality.  Advanced osteoarthritis of the left knee is worst in the medial compartment where there is bone-on-bone joint space narrowing.  Small Baker's cyst contains loose bodies.   Electronically Signed   By: Inge Rise M.D.   On: 12/30/2017 12:05    Knee-R DG 1-2 views: No results found for this or any previous visit. Knee-L DG 1-2 views:  Results for orders placed during the hospital encounter of 02/09/18  DG Knee 1-2 Views Left   Narrative CLINICAL DATA:  Status post left total knee joint prosthesis placement.  EXAM: LEFT KNEE - 1-2 VIEW  COMPARISON:  CT scan of the left knee of December 30, 2017  FINDINGS: The patient has undergone total left knee joint prosthesis placement. Radiographic positioning of the prosthetic components is good. The interface with the native bone is normal. There is air in fluid in the anterior aspect of the joint space. Skin staples  are present anteriorly.  IMPRESSION: No immediate postprocedure complication following left total knee joint prosthesis placement.   Electronically Signed   By: David  Martinique M.D.   On: 02/09/2018 12:00      Complexity Note: Imaging results reviewed. Results shared with Ms. Isa Rankin, using Layman's terms.                         ROS  Cardiovascular: No reported cardiovascular signs or symptoms such as High blood pressure, coronary artery disease, abnormal heart rate or rhythm, heart attack, blood thinner therapy or heart weakness and/or failure Pulmonary or Respiratory: No reported pulmonary signs or symptoms such as wheezing and difficulty taking a deep full breath (Asthma), difficulty blowing air out (Emphysema), coughing up mucus (Bronchitis), persistent dry cough, or temporary stoppage of breathing during sleep Neurological: No reported neurological signs or symptoms such as seizures, abnormal skin sensations, urinary and/or fecal incontinence, being born with an abnormal open spine and/or a tethered spinal cord Review of Past Neurological Studies: No results found for this or any previous visit. Psychological-Psychiatric: Anxiousness Gastrointestinal: No reported gastrointestinal signs or symptoms such as vomiting or evacuating blood, reflux, heartburn, alternating episodes of diarrhea and constipation, inflamed or scarred liver, or pancreas or irrregular and/or infrequent bowel movements Genitourinary: No reported renal or genitourinary signs or symptoms such as difficulty voiding or producing urine, peeing blood, non-functioning kidney, kidney stones, difficulty emptying the bladder, difficulty controlling the flow of urine, or chronic kidney disease Hematological: Weakness due to low blood hemoglobin or red blood cell count (Anemia) Endocrine: High thyroid Rheumatologic: No reported rheumatological signs and symptoms such as fatigue, joint pain, tenderness, swelling, redness, heat,  stiffness, decreased range of motion, with or without associated rash Musculoskeletal: Negative for myasthenia gravis, muscular dystrophy, multiple sclerosis or malignant hyperthermia Work History: Disabled  Allergies  Ms. Burditt is allergic to penicillins.  Laboratory Chemistry  Inflammation Markers (CRP: Acute Phase) (ESR: Chronic Phase) Lab Results  Component Value Date   ESRSEDRATE 43 (H) 01/27/2018                         Rheumatology Markers No results found for: RF, ANA, LABURIC, URICUR, LYMEIGGIGMAB, LYMEABIGMQN, HLAB27                      Renal Function Markers Lab Results  Component Value Date   BUN 12 02/11/2018   CREATININE 1.00 05/18/2018   GFRAA >60 05/18/2018   GFRNONAA >60 05/18/2018  Hepatic Function Markers No results found for: AST, ALT, ALBUMIN, ALKPHOS, HCVAB, AMYLASE, LIPASE, AMMONIA                      Electrolytes Lab Results  Component Value Date   NA 136 02/11/2018   K 4.1 02/11/2018   CL 102 02/11/2018   CALCIUM 9.0 02/11/2018                        Neuropathy Markers No results found for: VITAMINB12, FOLATE, HGBA1C, HIV                      CNS Tests No results found for: COLORCSF, APPEARCSF, RBCCOUNTCSF, WBCCSF, POLYSCSF, LYMPHSCSF, EOSCSF, PROTEINCSF, GLUCCSF, JCVIRUS, CSFOLI, IGGCSF                      Bone Pathology Markers No results found for: VD25OH, [id number], [id number], [id number], 25OHVITD1, 25OHVITD2, 25OHVITD3, TESTOFREE, TESTOSTERONE                       Coagulation Parameters Lab Results  Component Value Date   INR 1.00 01/27/2018   LABPROT 13.1 01/27/2018   APTT 34 01/27/2018   PLT 259 05/18/2018                        Cardiovascular Markers Lab Results  Component Value Date   HGB 12.2 05/18/2018   HCT 36.0 05/18/2018                         CA Markers No results found for: CEA, CA125, LABCA2                      Note: Lab results reviewed.  [place]  Drug: Ms. Barradas  reports  that she has current or past drug history. Drug: Marijuana. Alcohol:  reports that she does not drink alcohol. Tobacco:  reports that she has never smoked. She has never used smokeless tobacco. Medical:  has a past medical history of Anemia, Anxiety, Arthritis, Complication of anesthesia, Dysrhythmia, Hypothyroidism, and PONV (postoperative nausea and vomiting). Family: family history is not on file.  Past Surgical History:  Procedure Laterality Date  . JOINT REPLACEMENT    . KNEE ARTHROSCOPY Bilateral 2010, 2012  . KNEE ARTHROTOMY Left 05/18/2018   Procedure: KNEE ARTHROTOMY AND SYNOVECTOMY;  Surgeon: Hessie Knows, MD;  Location: [facility] ORS;  Service: Orthopedics;  Laterality: Left;  . KNEE CLOSED REDUCTION Left 03/16/2018   Procedure: CLOSED MANIPULATION KNEE;  Surgeon: Hessie Knows, MD;  Location: [facility] ORS;  Service: Orthopedics;  Laterality: Left;  . TONSILLECTOMY    . TOTAL KNEE ARTHROPLASTY Left 02/09/2018   Procedure: TOTAL KNEE ARTHROPLASTY;  Surgeon: Hessie Knows, MD;  Location: [facility] ORS;  Service: Orthopedics;  Laterality: Left;   Active Ambulatory Problems    Diagnosis Date Noted  . Status post total knee replacement using cement, left 02/09/2018  . Arthrofibrosis of total knee arthroplasty, sequela 05/18/2018   Resolved Ambulatory Problems    Diagnosis Date Noted  . No Resolved Ambulatory Problems   Past Medical History:  Diagnosis Date  . Anemia   . Anxiety   . Arthritis   . Complication of anesthesia   . Dysrhythmia   . Hypothyroidism   . PONV (postoperative nausea and vomiting)    Constitutional Exam  General appearance: Well  nourished, well developed, and well hydrated. In no apparent acute distress Vitals:   08/17/18 0801  BP: 136/89  Pulse: 71  Resp: 18  Temp: 98 F (36.7 C)  SpO2: 100%  Weight: 194 lb (88 kg)  Height: _0  (1.778 m)   BMI Assessment: Estimated body mass index is 27.84 kg/m as calculated from the following:   Height as of this  encounter: _1  (1.778 m).   Weight as of this encounter: 194 lb (88 kg).  BMI interpretation table: BMI level Category Range association with higher incidence of chronic pain  <18 kg/m2 Underweight   18.5-24.9 kg/m2 Ideal body weight   25-29.9 kg/m2 Overweight Increased incidence by 20%  30-34.9 kg/m2 Obese (Class I) Increased incidence by 68%  35-39.9 kg/m2 Severe obesity (Class II) Increased incidence by 136%  >40 kg/m2 Extreme obesity (Class III) Increased incidence by 254%   Patient's current BMI Ideal Body weight  Body mass index is 27.84 kg/m. Ideal body weight: 68.5 kg (151 lb 0.2 oz) Adjusted ideal body weight: 76.3 kg (168 lb 3.3 oz)   BMI Readings from Last 4 Encounters:  08/17/18 27.84 kg/m  05/18/18 28.98 kg/m  05/17/18 29.01 kg/m  03/16/18 32.08 kg/m   Wt Readings from Last 4 Encounters:  08/17/18 194 lb (88 kg)  05/18/18 207 lb 12.8 oz (94.3 kg)  05/17/18 208 lb (94.3 kg)  03/16/18 230 lb (104.3 kg)  Psych/Mental status: Alert, oriented x 3 (person, place, & time)       Eyes: PERLA Respiratory: No evidence of acute respiratory distress  Cervical Spine Area Exam  Skin & Axial Inspection: No masses, redness, edema, swelling, or associated skin lesions Alignment: Symmetrical Functional ROM: Unrestricted ROM      Stability: No instability detected Muscle Tone/Strength: Functionally intact. No obvious neuro-muscular anomalies detected. Sensory (Neurological): Unimpaired Palpation: No palpable anomalies              Upper Extremity (UE) Exam    Side: Right upper extremity  Side: Left upper extremity  Skin & Extremity Inspection: Skin color, temperature, and hair growth are WNL. No peripheral edema or cyanosis. No masses, redness, swelling, asymmetry, or associated skin lesions. No contractures.  Skin & Extremity Inspection: Skin color, temperature, and hair growth are WNL. No peripheral edema or cyanosis. No masses, redness, swelling, asymmetry, or associated  skin lesions. No contractures.  Functional ROM: Unrestricted ROM          Functional ROM: Unrestricted ROM          Muscle Tone/Strength: Functionally intact. No obvious neuro-muscular anomalies detected.  Muscle Tone/Strength: Functionally intact. No obvious neuro-muscular anomalies detected.  Sensory (Neurological): Unimpaired          Sensory (Neurological): Unimpaired          Palpation: No palpable anomalies              Palpation: No palpable anomalies              Provocative Test(s):  Phalen's test: deferred Tinel's test: deferred Apley's scratch test (touch opposite shoulder):  Action 1 (Across chest): deferred Action 2 (Overhead): deferred Action 3 (LB reach): deferred   Provocative Test(s):  Phalen's test: deferred Tinel's test: deferred Apley's scratch test (touch opposite shoulder):  Action 1 (Across chest): deferred Action 2 (Overhead): deferred Action 3 (LB reach): deferred    Thoracic Spine Area Exam  Skin & Axial Inspection: No masses, redness, or swelling Alignment: Symmetrical Functional ROM: Unrestricted ROM Stability: No  instability detected Muscle Tone/Strength: Functionally intact. No obvious neuro-muscular anomalies detected. Sensory (Neurological): Unimpaired Muscle strength & Tone: No palpable anomalies  Lumbar Spine Area Exam  Skin & Axial Inspection: No masses, redness, or swelling Alignment: Symmetrical Functional ROM: Unrestricted ROM       Stability: No instability detected Muscle Tone/Strength: Functionally intact. No obvious neuro-muscular anomalies detected. Sensory (Neurological): Unimpaired Palpation: No palpable anomalies       Provocative Tests: Hyperextension/rotation test: deferred today       Lumbar quadrant test (Kemp's test): deferred today       Lateral bending test: deferred today       Patrick's Maneuver: deferred today                   FABER test: deferred today                   S-I anterior distraction/compression test:  deferred today         S-I lateral compression test: deferred today         S-I Thigh-thrust test: deferred today         S-I Gaenslen's test: deferred today          Gait & Posture Assessment  Ambulation: Unassisted Gait: Relatively normal for age and body habitus Posture: WNL   Lower Extremity Exam    Side: Right lower extremity  Side: Left lower extremity  Stability: No instability observed          Stability: Unstable for knee joint  Skin & Extremity Inspection: Skin color, temperature, and hair growth are WNL. No peripheral edema or cyanosis. No masses, redness, swelling, asymmetry, or associated skin lesions. No contractures.  Skin & Extremity Inspection: Evidence of prior arthroplastic surgery  Functional ROM: Unrestricted ROM                  Functional ROM: Decreased ROM for knee joint          Muscle Tone/Strength: Functionally intact. No obvious neuro-muscular anomalies detected.  Muscle Tone/Strength: Functionally intact. No obvious neuro-muscular anomalies detected.  Sensory (Neurological): Unimpaired  Sensory (Neurological): Articular pain pattern, musculoskeletal, neuropathic  Palpation: No palpable anomalies  Palpation: No palpable anomalies   Assessment  Primary Diagnosis & Pertinent Problem List: The primary encounter diagnosis was Arthritis of left knee. Diagnoses of Status post total knee replacement using cement, left, Arthrofibrosis of total knee arthroplasty, sequela, and Chronic pain syndrome were also pertinent to this visit.  Visit Diagnosis (New problems to examiner): 1. Arthritis of left knee   2. Status post total knee replacement using cement, left   3. Arthrofibrosis of total knee arthroplasty, sequela   4. Chronic pain syndrome    Patient is a pleasant 57 year old female with a history of left knee replacement surgery and February 2019 status post manipulation under anesthesia subsequently and April followed by revision surgery in June who presents with  left knee pain and left knee instability.  Patient has been participating in land-based and aquatic-based physical therapy.  She is referred here for consideration of left knee genicular nerve block and possible radio frequency ablation.  Risks and benefits of the procedure were discussed in great detail with the patient.  Patient would like to proceed.  We will get her scheduled for a diagnostic left knee genicular nerve block under fluoroscopy with sedation.  Pending how she does, we will consider cooled radiofrequency ablation of left knee genicular nerves.  Patient to continue medication management with  PCP.  Plan of Care (Initial workup plan)   Ordered Lab-work, Procedure(s), Referral(s), & Consult(s): Orders Placed This Encounter  Procedures  . GENICULAR NERVE BLOCK     Provider-requested follow-up: Return in about 2 weeks (around 08/31/2018).  Future Appointments  Date Time Provider [place]  08/25/2018  9:45 AM Gillis Santa, MD [facility] None    Primary Care Physician: [facility], [place] Location: [facility] [facility] Note by: Gillis Santa, M.D, Date: 08/17/2018; Time: 8:29 AM  Patient Instructions  Preparing for Procedure with Sedation Instructions: . Oral Intake: Do not eat or drink anything for at least 8 hours prior to your procedure. . Transportation: Public transportation is not allowed. Bring an adult driver. The driver must be physically present in our waiting room before any procedure can be started. Marland Kitchen Physical Assistance: Bring an adult capable of physically assisting you, in the event you need help. . Blood Pressure Medicine: Take your blood pressure medicine with a sip of water the morning of the procedure. . Insulin: Take only  of your normal insulin dose. . Preventing infections: Shower with an antibacterial soap the morning of your procedure. . Build-up your immune system: Take 1000 mg of Vitamin C with every meal (3 times  a day) the day prior to your procedure. . Pregnancy: If you are pregnant, call and cancel the procedure. . Sickness: If you have a cold, fever, or any active infections, call and cancel the procedure. . Arrival: You must be in the facility at least 30 minutes prior to your scheduled procedure. . Children: Do not bring children with you. . Dress appropriately: Bring dark clothing that you would not mind if they get stained. . Valuables: Do not bring any jewelry or valuables. Procedure appointments are reserved for interventional treatments only. Marland Kitchen No Prescription Refills. . No medication changes will be discussed during procedure appointments. . No disability issues will be discussed.

## 2018-08-17 NOTE — Progress Notes (Signed)
Safety precautions to be maintained throughout the outpatient stay will include: orient to surroundings, keep bed in low position, maintain call bell within reach at all times, provide assistance with transfer out of bed and ambulation.  

## 2018-08-25 ENCOUNTER — Other Ambulatory Visit: Payer: Self-pay

## 2018-08-25 ENCOUNTER — Ambulatory Visit (HOSPITAL_BASED_OUTPATIENT_CLINIC_OR_DEPARTMENT_OTHER): Payer: Medicare Other | Admitting: Student in an Organized Health Care Education/Training Program

## 2018-08-25 ENCOUNTER — Encounter: Payer: Self-pay | Admitting: Student in an Organized Health Care Education/Training Program

## 2018-08-25 ENCOUNTER — Ambulatory Visit
Admission: RE | Admit: 2018-08-25 | Discharge: 2018-08-25 | Disposition: A | Discharge status: Home / Self Care | Payer: Medicare Other | Source: Ambulatory Visit | Attending: Student in an Organized Health Care Education/Training Program | Admitting: Student in an Organized Health Care Education/Training Program

## 2018-08-25 VITALS — BP 118/65 | HR 77 | Temp 98.0°F | Resp 16 | Ht 70.0 in | Wt 194.0 lb

## 2018-08-25 DIAGNOSIS — Z96652 Presence of left artificial knee joint: Secondary | ICD-10-CM | POA: Insufficient documentation

## 2018-08-25 DIAGNOSIS — M25562 Pain in left knee: Secondary | ICD-10-CM | POA: Insufficient documentation

## 2018-08-25 DIAGNOSIS — M1712 Unilateral primary osteoarthritis, left knee: Secondary | ICD-10-CM | POA: Insufficient documentation

## 2018-08-25 DIAGNOSIS — G894 Chronic pain syndrome: Secondary | ICD-10-CM | POA: Insufficient documentation

## 2018-08-25 DIAGNOSIS — Z880 Allergy status to penicillin: Secondary | ICD-10-CM | POA: Diagnosis not present

## 2018-08-25 DIAGNOSIS — T8482XS Fibrosis due to internal orthopedic prosthetic devices, implants and grafts, sequela: Secondary | ICD-10-CM

## 2018-08-25 MED ORDER — ROPIVACAINE HCL 2 MG/ML IJ SOLN
INTRAMUSCULAR | Status: AC
  Filled 2018-08-25: qty 10

## 2018-08-25 MED ORDER — ROPIVACAINE HCL 2 MG/ML IJ SOLN
10.0000 mL | Freq: Once | INTRAMUSCULAR | Status: AC
  Administered 2018-08-25: 10 mL

## 2018-08-25 MED ORDER — DEXAMETHASONE SODIUM PHOSPHATE 10 MG/ML IJ SOLN
INTRAMUSCULAR | Status: AC
  Filled 2018-08-25: qty 1

## 2018-08-25 MED ORDER — LACTATED RINGERS IV SOLN
1000.0000 mL | Freq: Once | INTRAVENOUS | Status: AC
  Administered 2018-08-25: 1000 mL via INTRAVENOUS

## 2018-08-25 MED ORDER — DEXAMETHASONE SODIUM PHOSPHATE 10 MG/ML IJ SOLN
10.0000 mg | Freq: Once | INTRAMUSCULAR | Status: AC
  Administered 2018-08-25: 10 mg

## 2018-08-25 MED ORDER — LIDOCAINE HCL 2 % IJ SOLN
INTRAMUSCULAR | Status: AC
  Filled 2018-08-25: qty 20

## 2018-08-25 MED ORDER — FENTANYL CITRATE (PF) 100 MCG/2ML IJ SOLN
INTRAMUSCULAR | Status: AC
  Filled 2018-08-25: qty 2

## 2018-08-25 MED ORDER — FENTANYL CITRATE (PF) 100 MCG/2ML IJ SOLN
25.0000 ug | INTRAMUSCULAR | Status: DC | PRN
  Administered 2018-08-25: 100 ug via INTRAVENOUS

## 2018-08-25 MED ORDER — LIDOCAINE HCL 2 % IJ SOLN
20.0000 mL | Freq: Once | INTRAMUSCULAR | Status: AC
  Administered 2018-08-25: 400 mg

## 2018-08-25 NOTE — Progress Notes (Signed)
Safety precautions to be maintained throughout the outpatient stay will include: orient to surroundings, keep bed in low position, maintain call bell within reach at all times, provide assistance with transfer out of bed and ambulation.  

## 2018-08-25 NOTE — Patient Instructions (Signed)

## 2018-08-25 NOTE — Progress Notes (Signed)
Patient's Name: Alyssa Hughes  MRN: [id number]  Referring Provider: [facility]  DOB: [birth year]  PCP: Willa Rough, [facility]  DOS: 08/25/2018  Note by: Edward Jolly, MD  Service setting: Ambulatory outpatient  Specialty: Interventional Pain Management  Patient type: Established  Location: [facility] (AMB) Pain Management Facility  Visit type: Interventional Procedure   Primary Reason for Visit: Interventional Pain Management Treatment. CC: Knee Pain (left)  Procedure:          Anesthesia, Analgesia, Anxiolysis:  Type: Genicular Nerves Block (Superior-lateral, Superior-medial, and Inferior-medial Genicular Nerves)          CPT: 64450      Primary Purpose: Diagnostic Region: Lateral, Anterior, and Medial aspects of the knee joint, above and below the knee joint proper. Level: Superior and inferior to the knee joint. Target Area: For Genicular Nerve block(s), the targets are: the superior-lateral genicular nerve, located in the lateral distal portion of the femoral shaft as it curves to form the lateral epicondyle, in the region of the distal femoral metaphysis; the superior-medial genicular nerve, located in the medial distal portion of the femoral shaft as it curves to form the medial epicondyle; and the inferior-medial genicular nerve, located in the medial, proximal portion of the tibial shaft, as it curves to form the medial epicondyle, in the region of the proximal tibial metaphysis. Approach: Anterior, percutaneous, ipsilateral approach. Laterality: Left knee  Type: Moderate (Conscious) Sedation combined with Local Anesthesia Indication(s): Analgesia and Anxiety Route: Intravenous (IV) IV Access: Secured Sedation: Meaningful verbal contact was maintained at all times during the procedure  Local Anesthetic: Lidocaine 1-2%  Position: Modified Fowler's position with pillows under the targeted knee(s).   Indications: 1. Arthritis of left knee   2. Status post total knee replacement  using cement, left   3. Arthrofibrosis of total knee arthroplasty, sequela   4. Chronic pain syndrome    Pain Score: Pre-procedure: 5 /10 Post-procedure: 0-No pain/10  Pre-op Assessment:  Alyssa Hughes is a 57 y.o. (year old), female patient, seen today for interventional treatment. She  has a past surgical history that includes Tonsillectomy; Knee arthroscopy (Bilateral, 2010, 2012); Total knee arthroplasty (Left, 02/09/2018); Joint replacement; Knee Closed Reduction (Left, 03/16/2018); and Knee arthrotomy (Left, 05/18/2018). Alyssa Hughes has a current medication list which includes the following prescription(s): citalopram, fluticasone, gabapentin, levothyroxine, methocarbamol, multiple vitamins-minerals, oxycodone, senna-docusate, and enoxaparin, and the following Facility-Administered Medications: fentanyl. Her primarily concern today is the Knee Pain (left)  Initial Vital Signs:  Pulse/HCG Rate: 77ECG Heart Rate: 68 Temp: 98 F (36.7 C) Resp: 16 BP: 109/77 SpO2: 100 %  BMI: Estimated body mass index is 27.84 kg/m as calculated from the following:   Height as of this encounter: 5\' 10"  (1.778 m).   Weight as of this encounter: 194 lb (88 kg).  Risk Assessment: Allergies: Reviewed. She is allergic to penicillins.  Allergy Precautions: None required Coagulopathies: Reviewed. None identified.  Blood-thinner therapy: None at this time Active Infection(s): Reviewed. None identified. Alyssa Hughes is afebrile  Site Confirmation: Alyssa Hughes was asked to confirm the procedure and laterality before marking the site Procedure checklist: Completed Consent: Before the procedure and under the influence of no sedative(s), amnesic(s), or anxiolytics, the patient was informed of the treatment options, risks and possible complications. To fulfill our ethical and legal obligations, as recommended by the American Medical Association's Code of Ethics, I have informed the patient of my clinical impression; the  nature and purpose of the treatment or procedure; the risks, benefits,  and possible complications of the intervention; the alternatives, including doing nothing; the risk(s) and benefit(s) of the alternative treatment(s) or procedure(s); and the risk(s) and benefit(s) of doing nothing. The patient was provided information about the general risks and possible complications associated with the procedure. These may include, but are not limited to: failure to achieve desired goals, infection, bleeding, organ or nerve damage, allergic reactions, paralysis, and death. In addition, the patient was informed of those risks and complications associated to the procedure, such as failure to decrease pain; infection; bleeding; organ or nerve damage with subsequent damage to sensory, motor, and/or autonomic systems, resulting in permanent pain, numbness, and/or weakness of one or several areas of the body; allergic reactions; (i.e.: anaphylactic reaction); and/or death. Furthermore, the patient was informed of those risks and complications associated with the medications. These include, but are not limited to: allergic reactions (i.e.: anaphylactic or anaphylactoid reaction(s)); adrenal axis suppression; blood sugar elevation that in diabetics may result in ketoacidosis or comma; water retention that in patients with history of congestive heart failure may result in shortness of breath, pulmonary edema, and decompensation with resultant heart failure; weight gain; swelling or edema; medication-induced neural toxicity; particulate matter embolism and blood vessel occlusion with resultant organ, and/or nervous system infarction; and/or aseptic necrosis of one or more joints. Finally, the patient was informed that Medicine is not an exact science; therefore, there is also the possibility of unforeseen or unpredictable risks and/or possible complications that may result in a catastrophic outcome. The patient indicated having  understood very clearly. We have given the patient no guarantees and we have made no promises. Enough time was given to the patient to ask questions, all of which were answered to the patient's satisfaction. Alyssa Hughes has indicated that she wanted to continue with the procedure. Attestation: I, the ordering provider, attest that I have discussed with the patient the benefits, risks, side-effects, alternatives, likelihood of achieving goals, and potential problems during recovery for the procedure that I have provided informed consent. Date  Time: 08/25/2018  9:04 AM  Pre-Procedure Preparation:  Monitoring: As per clinic protocol. Respiration, ETCO2, SpO2, BP, heart rate and rhythm monitor placed and checked for adequate function Safety Precautions: Patient was assessed for positional comfort and pressure points before starting the procedure. Time-out: I initiated and conducted the "Time-out" before starting the procedure, as per protocol. The patient was asked to participate by confirming the accuracy of the "Time Out" information. Verification of the correct person, site, and procedure were performed and confirmed by me, the nursing staff, and the patient. "Time-out" conducted as per Joint Commission's Universal Protocol (UP.01.01.01). Time: 1012  Description of Procedure:          Area Prepped: Entire knee area, from mid-thigh to mid-shin, lateral, anterior, and medial aspects. Prepping solution: ChloraPrep (2% chlorhexidine gluconate and 70% isopropyl alcohol) Safety Precautions: Aspiration looking for blood return was conducted prior to all injections. At no point did we inject any substances, as a needle was being advanced. No attempts were made at seeking any paresthesias. Safe injection practices and needle disposal techniques used. Medications properly checked for expiration dates. SDV (single dose vial) medications used. Latex Allergy precautions taken.   Description of the Procedure:  Protocol guidelines were followed. The patient was placed in position over the procedure table. The target area was identified and the area prepped in the usual manner. Skin & deeper tissues infiltrated with local anesthetic. Appropriate amount of time allowed to pass for local anesthetics  to take effect. The procedure needles were then advanced to the target area. Proper needle placement secured. Negative aspiration confirmed. Solution injected in intermittent fashion, asking for systemic symptoms every 0.5cc of injectate. The needles were then removed and the area cleansed, making sure to leave some of the prepping solution back to take advantage of its long term bactericidal properties.  Vitals:   08/25/18 1020 08/25/18 1030 08/25/18 1040 08/25/18 1052  BP: 138/83 131/63 138/67 118/65  Pulse:      Resp: 15 16 16 16   Temp:      TempSrc:      SpO2: 100% 98% 98% 98%  Weight:      Height:        Start Time: 1012 hrs. End Time: 1020 hrs. Materials:  Needle(s) Type: Spinal Needle Gauge: 22G Length: 3.5-in Medication(s): Please see orders for medications and dosing details. 5 cc solution made of 4 cc of 0.2% ropivacaine, 1 cc of Decadron 10 mg/cc.  1.5 cc injected at each level above. Imaging Guidance (Non-Spinal):          Type of Imaging Technique: Fluoroscopy Guidance (Non-Spinal) Indication(s): Assistance in needle guidance and placement for procedures requiring needle placement in or near specific anatomical locations not easily accessible without such assistance. Exposure Time: Please see nurses notes. Contrast: Before injecting any contrast, we confirmed that the patient did not have an allergy to iodine, shellfish, or radiological contrast. Once satisfactory needle placement was completed at the desired level, radiological contrast was injected. Contrast injected under live fluoroscopy. No contrast complications. See chart for type and volume of contrast used. Fluoroscopic Guidance: I  was personally present during the use of fluoroscopy. "Tunnel Vision Technique" used to obtain the best possible view of the target area. Parallax error corrected before commencing the procedure. "Direction-depth-direction" technique used to introduce the needle under continuous pulsed fluoroscopy. Once target was reached, antero-posterior, oblique, and lateral fluoroscopic projection used confirm needle placement in all planes. Images permanently stored in EMR. Interpretation: I personally interpreted the imaging intraoperatively. Adequate needle placement confirmed in multiple planes. Appropriate spread of contrast into desired area was observed. No evidence of afferent or efferent intravascular uptake. Permanent images saved into the patient's record.  Antibiotic Prophylaxis:   Anti-infectives (From admission, onward)   None     Indication(s): None identified  Post-operative Assessment:  Post-procedure Vital Signs:  Pulse/HCG Rate: 7781 Temp: 98 F (36.7 C) Resp: 16 BP: 118/65 SpO2: 98 %  EBL: None  Complications: No immediate post-treatment complications observed by team, or reported by patient.  Note: The patient tolerated the entire procedure well. A repeat set of vitals were taken after the procedure and the patient was kept under observation following institutional policy, for this type of procedure. Post-procedural neurological assessment was performed, showing return to baseline, prior to discharge. The patient was provided with post-procedure discharge instructions, including a section on how to identify potential problems. Should any problems arise concerning this procedure, the patient was given instructions to immediately contact us, at any time, without hesitation. In any case, we plan to contact the patient by telephone for a follow-up status report regarding this interventional procedure.  Comments:  No additional relevant information.  Plan of Care   Imaging Orders      DG C-Arm 1-60 Min-No Report Procedure Orders    No procedure(s) ordered today    Medications ordered for procedure: Meds ordered this encounter  Medications  . lactated ringers infusion 1,000 mL  . fentaNYL (SUBLIMAZE)  injection 25-100 mcg    Make sure Narcan is available in the pyxis when using this medication. In the event of respiratory depression (RR< 8/min): Titrate NARCAN (naloxone) in increments of 0.1 to 0.2 mg IV at 2-3 minute intervals, until desired degree of reversal.  . ropivacaine (PF) 2 mg/mL (0.2%) (NAROPIN) injection 10 mL  . lidocaine (XYLOCAINE) 2 % (with pres) injection 400 mg  . dexamethasone (DECADRON) injection 10 mg   Medications administered: We administered lactated ringers, fentaNYL, ropivacaine (PF) 2 mg/mL (0.2%), lidocaine, and dexamethasone.  See the medical record for exact dosing, route, and time of administration.  New Prescriptions   No medications on file   Disposition: Discharge home  Discharge Date & Time: 08/25/2018; 1054 hrs.   Physician-requested Follow-up: Return in about 4 weeks (around 09/22/2018) for Post Procedure Evaluation.  Future Appointments  Date Time Provider Department Center  09/21/2018  8:30 AM Edward Jolly, MD [facility] None   Primary Care Physician: [facility] Location: [facility] Facility Note by: Edward Jolly, MD Date: 08/25/2018; Time: 11:22 AM  Disclaimer:  Medicine is not an exact science. The only guarantee in medicine is that nothing is guaranteed. It is important to note that the decision to proceed with this intervention was based on the information collected from the patient. The Data and conclusions were drawn from the patient's questionnaire, the interview, and the physical examination. Because the information was provided in large part by the patient, it cannot be guaranteed that it has not been purposely or unconsciously manipulated. Every effort has been made to obtain as  much relevant data as possible for this evaluation. It is important to note that the conclusions that lead to this procedure are derived in large part from the available data. Always take into account that the treatment will also be dependent on availability of resources and existing treatment guidelines, considered by other Pain Management Practitioners as being common knowledge and practice, at the time of the intervention. For Medico-Legal purposes, it is also important to point out that variation in procedural techniques and pharmacological choices are the acceptable norm. The indications, contraindications, technique, and results of the above procedure should only be interpreted and judged by a Board-Certified Interventional Pain Specialist with extensive familiarity and expertise in the same exact procedure and technique.

## 2018-08-26 ENCOUNTER — Telehealth: Payer: Self-pay | Admitting: *Deleted

## 2018-08-26 NOTE — Telephone Encounter (Signed)
Attempted to call for post procedure follow-up. No answer, unable to leave a message. 

## 2018-09-21 ENCOUNTER — Ambulatory Visit: Payer: Medicare Other | Admitting: Student in an Organized Health Care Education/Training Program

## 2019-10-12 IMAGING — CT CT KNEE*L* W/O CM
3 of 6 series · 11 of 33 positions shown, 13 images · non-contrast
Comparison: None.

CLINICAL DATA: Patient for left knee replacement. Preoperative My
Knee protocol planning study.

EXAM:
CT OF THE LEFT KNEE WITHOUT CONTRAST
TECHNIQUE: Multidetector CT imaging of the left knee was performed according to
the standard protocol. Multiplanar CT image reconstructions were
also generated. Axial imaging only of the left hip and ankle was
also performed.

[Series 8: coronal st · coronal · 0.31mm/px · 3 of 97 slices shown]
[im 20/97  bone]
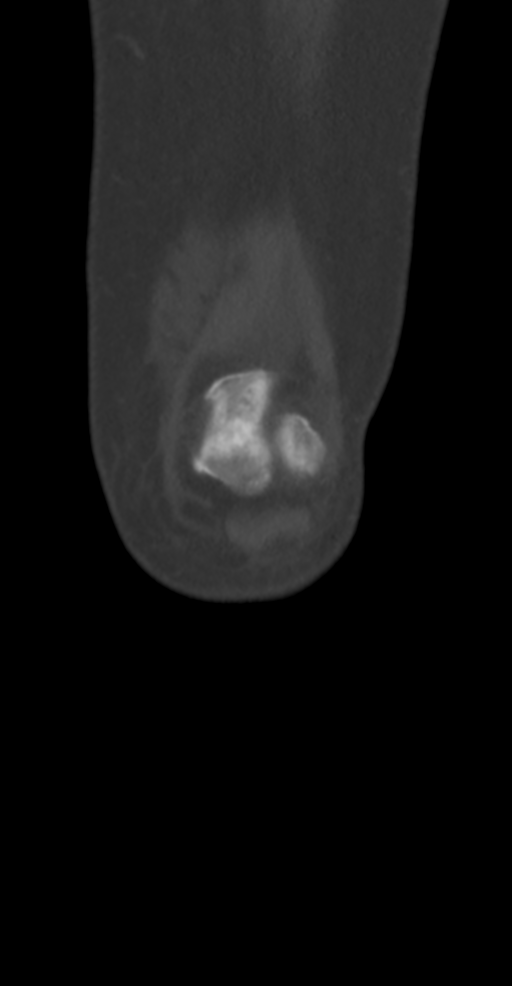
[im 39/97  bone]
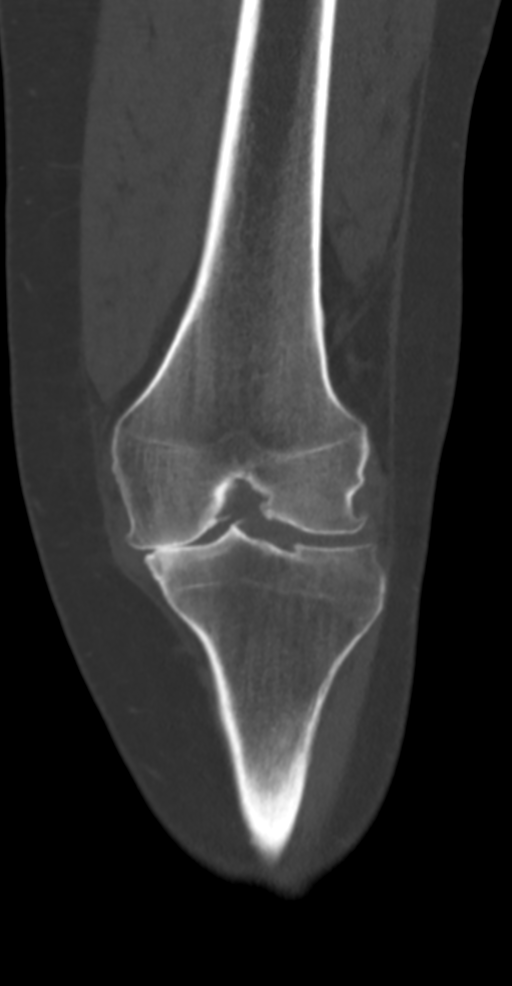
[im 58/97  bone]
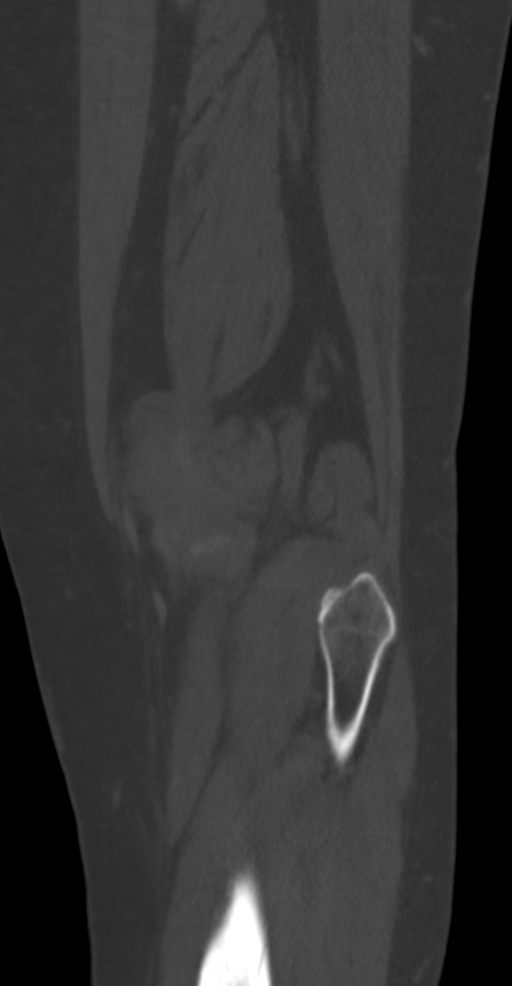

[Series 9: sagittal st · sagittal · 0.38mm/px · 5 of 82 slices shown, 6 images]
[im 28/82  bone]
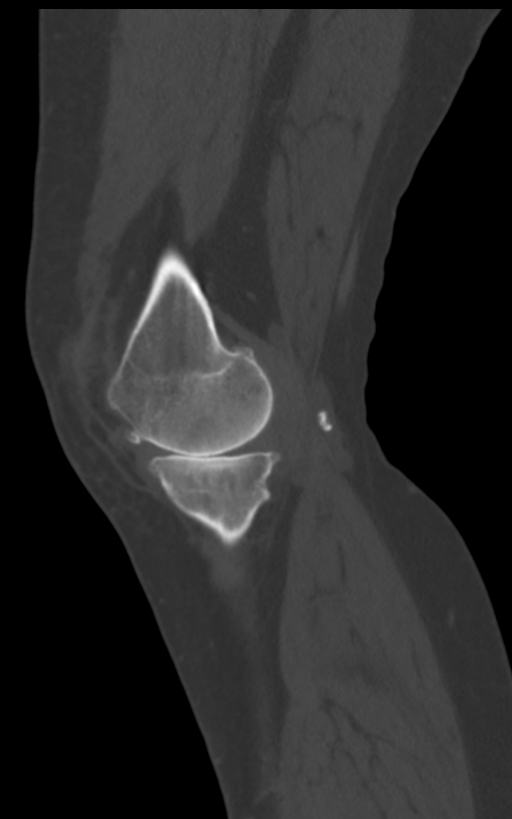
[im 34/82  bone]
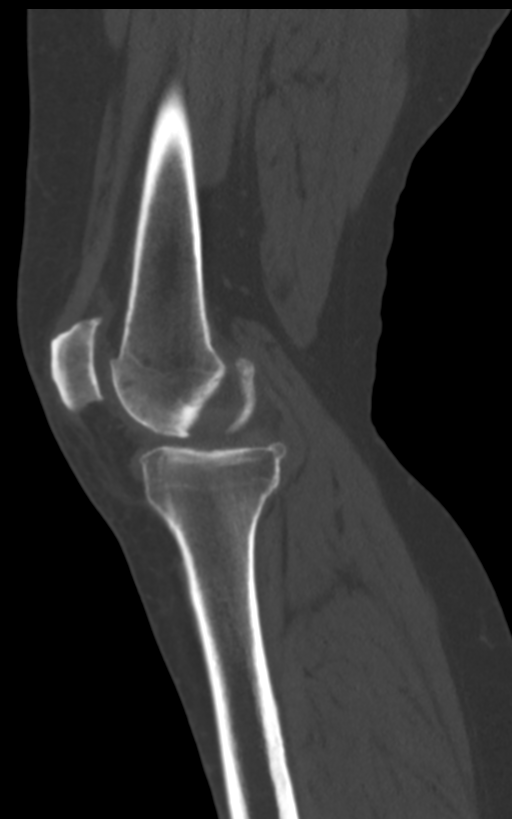
[im 41/82  soft-tissue]
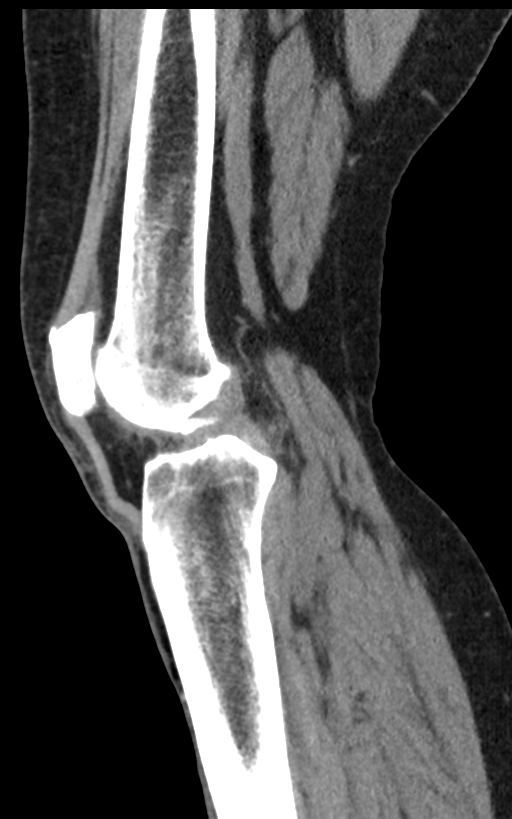
[im 41/82  bone]
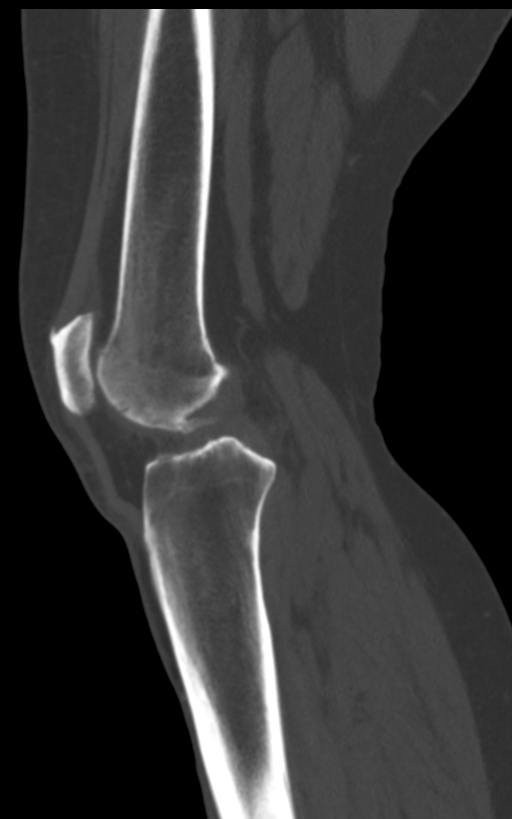
[im 48/82  bone]
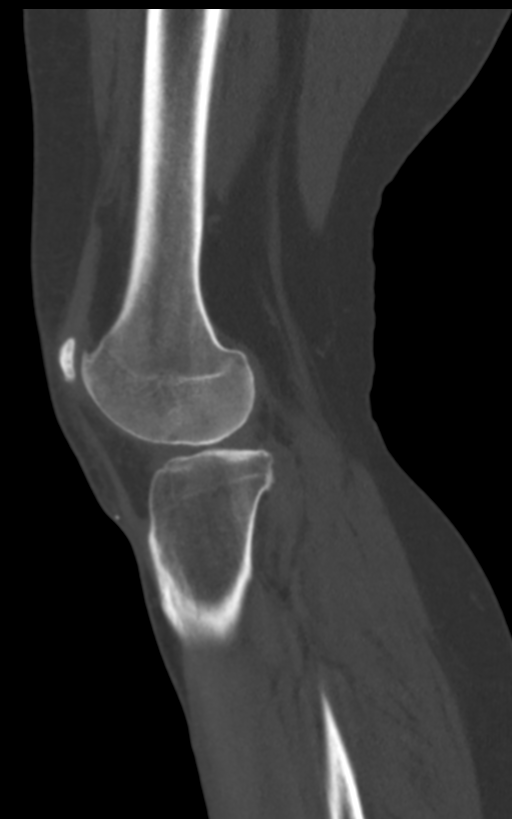
[im 55/82  bone]
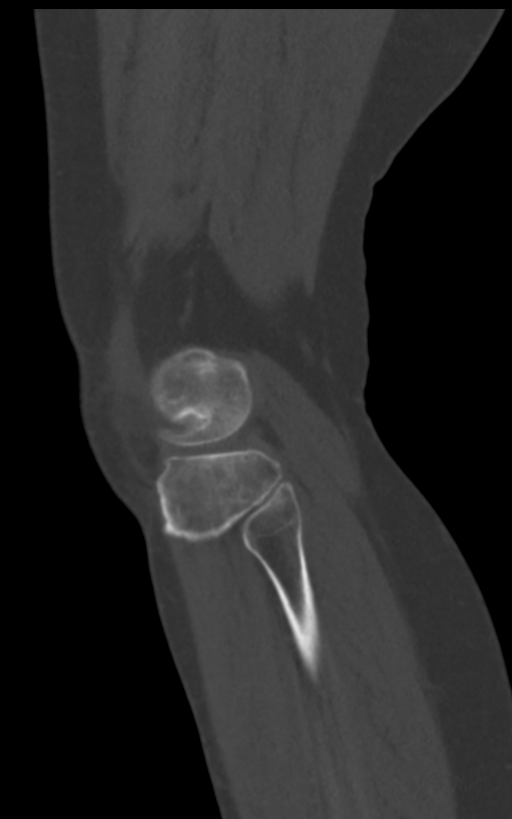

[Series 12: axial st · axial · 0.39mm/px · z∈[-320,-140]mm · 3 of 315 slices shown, 4 images]
[im 90/315  soft-tissue]
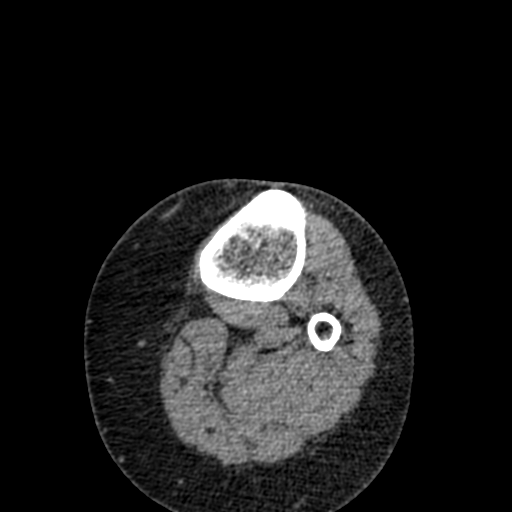
[im 90/315  bone]
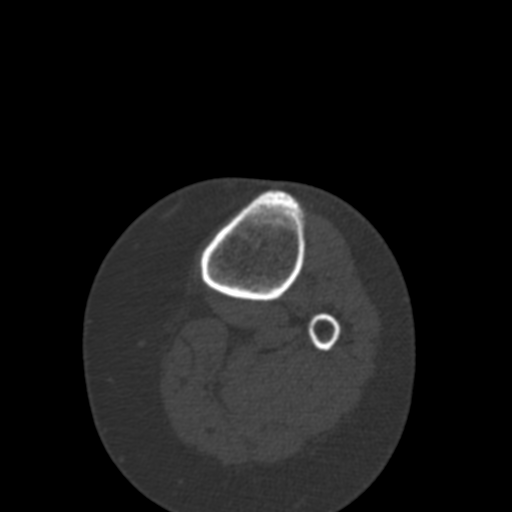
[im 180/315  bone]
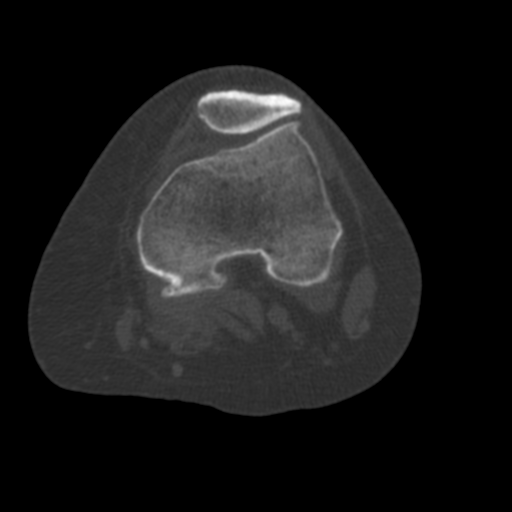
[im 270/315  bone]
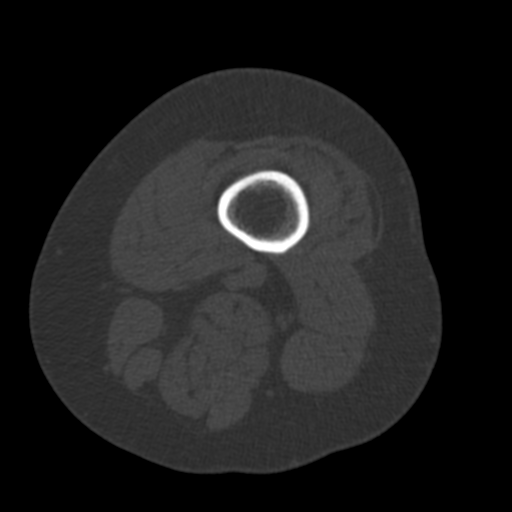

[11 of 33 positions shown; findings below may reference images not displayed]

FINDINGS: Bones/Joint/Cartilage

Tricompartmental degenerative disease about the left knee is worst
medially where there is bone-on-bone joint space narrowing. No acute
abnormality is identified. No focal bony lesion. Axial imaging only
of the left hip and ankle demonstrates no acute or focal bony
abnormality. Degenerative change about the symphysis pubis is noted.

Ligaments

Suboptimally assessed by CT.

Muscles and Tendons

Intact.

Soft tissues

The patient has a Baker's cyst measuring approximately 1.6 cm AP by
1 cm transverse by 4 cm craniocaudal. The cyst contains 3-4 loose
bodies measuring up to 0.8 cm.
IMPRESSION: No acute abnormality.

Advanced osteoarthritis of the left knee is worst in the medial
compartment where there is bone-on-bone joint space narrowing.

Small Baker's cyst contains loose bodies.
